# Patient Record
Sex: Female | Born: 2000 | Race: Black or African American | Hispanic: No | Marital: Single | Smoking: Never smoker
Health system: Southern US, Community
[De-identification: ages and names within clinical notes are randomized; demographics above are authoritative.]

## PROBLEM LIST (undated history)

## (undated) DIAGNOSIS — J45909 Unspecified asthma, uncomplicated: Secondary | ICD-10-CM

## (undated) DIAGNOSIS — L309 Dermatitis, unspecified: Secondary | ICD-10-CM

## (undated) HISTORY — PX: NO PAST SURGERIES: SHX2092

---
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
## 2018-01-08 IMAGING — CR DG CHEST 2V
2 series · 2 of 2 positions shown · non-contrast
Comparison: None.

CLINICAL DATA: Acute onset of wheezing, generalized weakness,
chills and vomiting. Initial encounter.

EXAM:
CHEST  2 VIEW

[w chest pa]
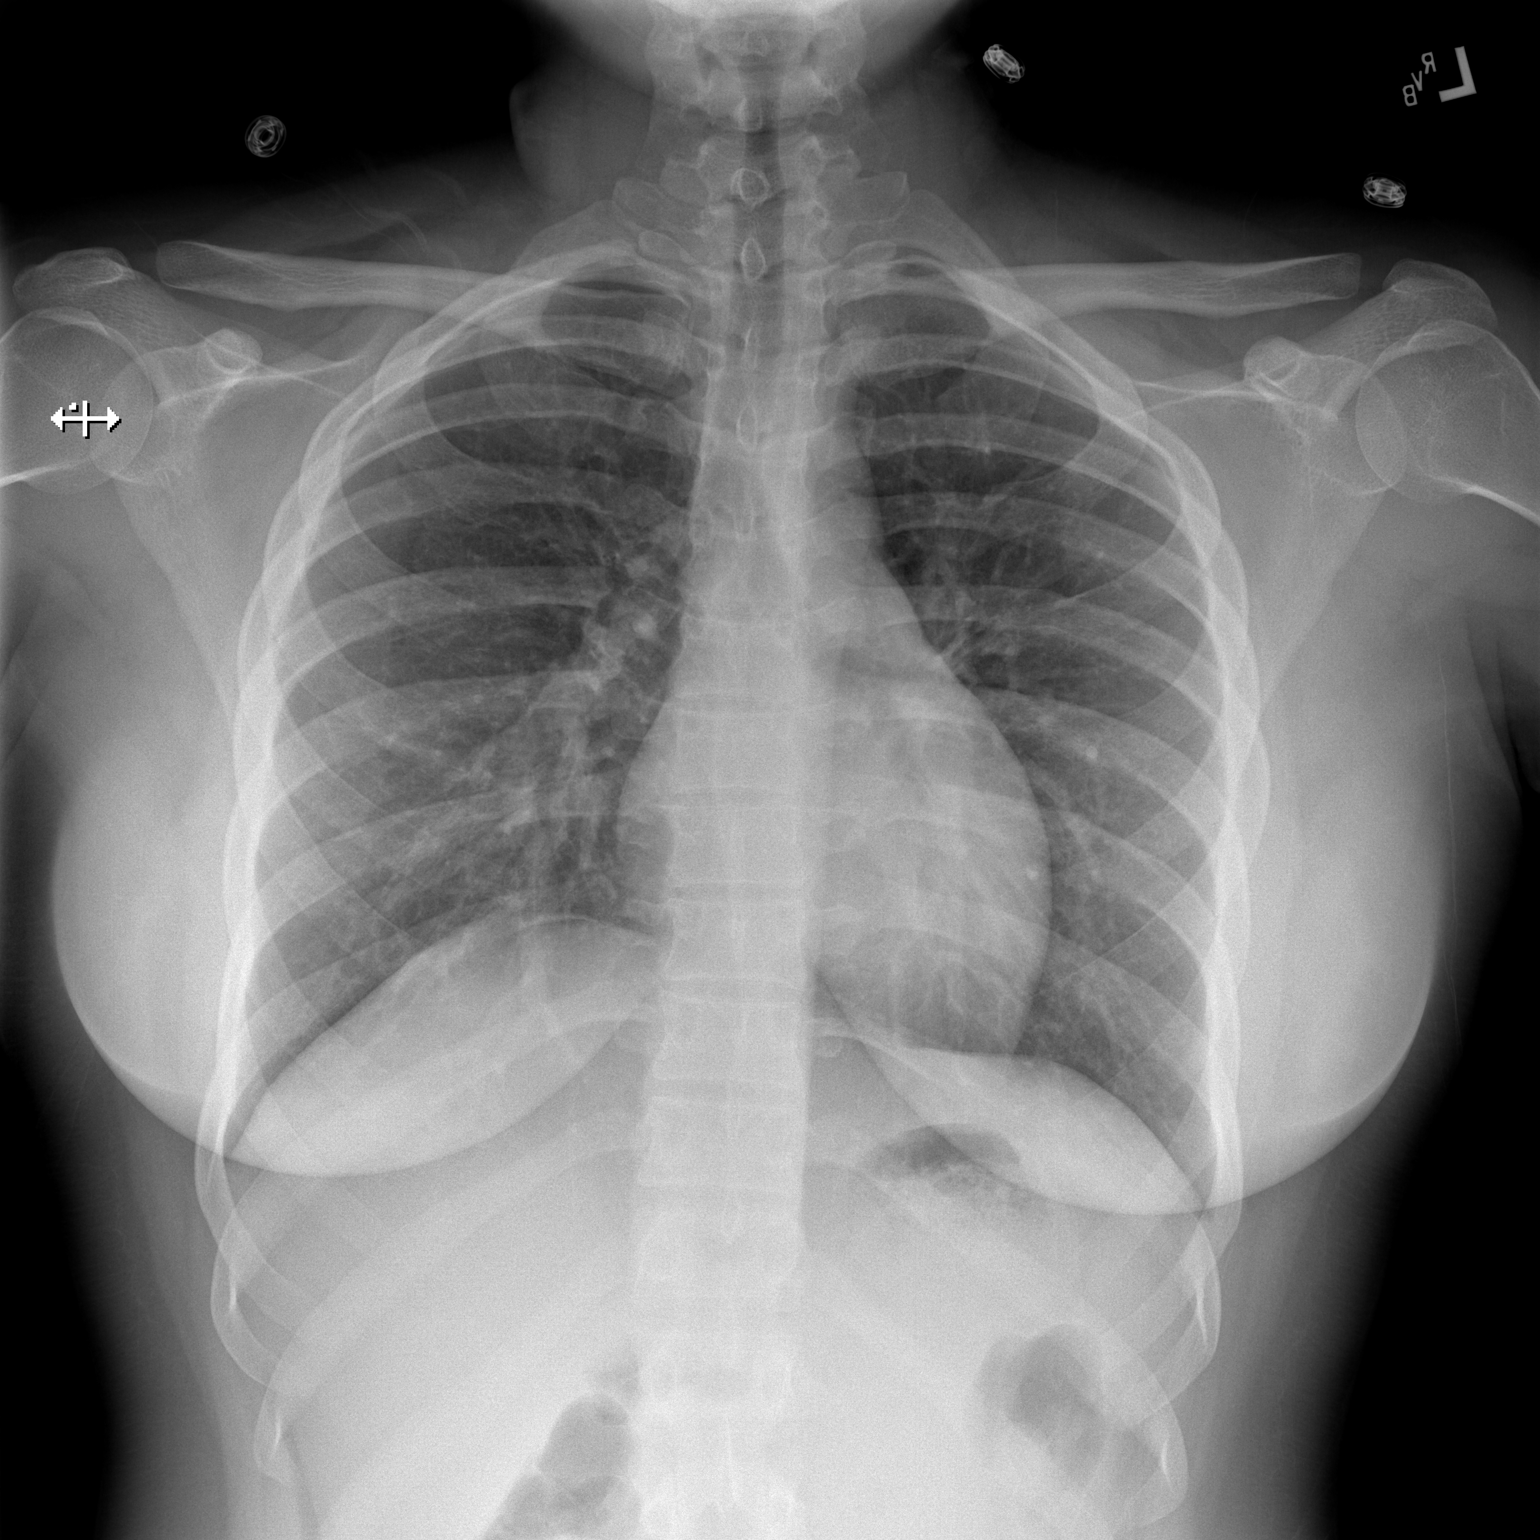

[w chest lat]
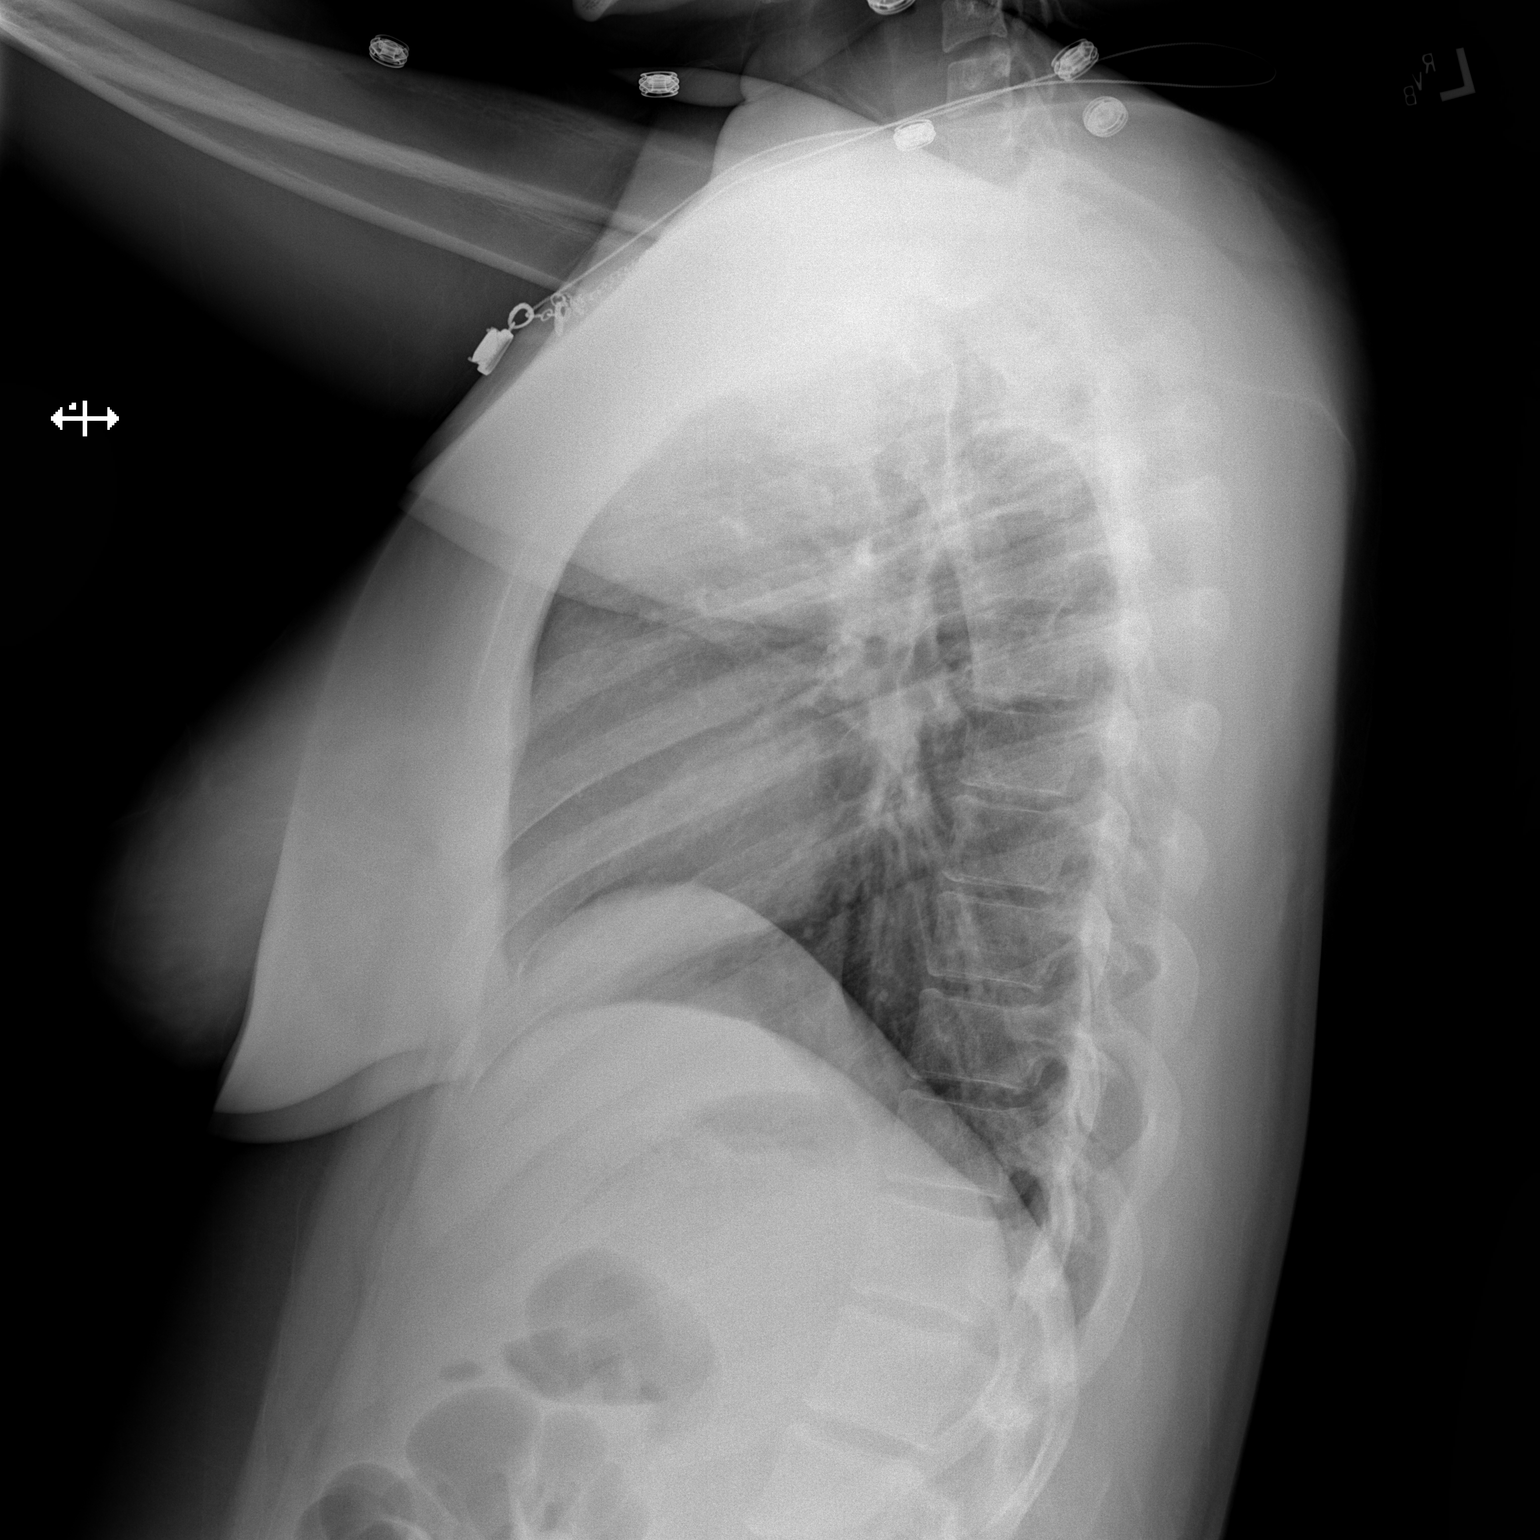

[2 of 2 positions shown; findings below may reference images not displayed]

FINDINGS: The lungs are well-aerated and clear. There is no evidence of focal
opacification, pleural effusion or pneumothorax.

The heart is normal in size; the mediastinal contour is within
normal limits. No acute osseous abnormalities are seen.
IMPRESSION: No acute cardiopulmonary process seen.

## 2020-02-20 DIAGNOSIS — Z20822 Contact with and (suspected) exposure to covid-19: Secondary | ICD-10-CM | POA: Diagnosis not present

## 2020-02-20 DIAGNOSIS — R0602 Shortness of breath: Secondary | ICD-10-CM | POA: Diagnosis not present

## 2020-02-20 DIAGNOSIS — Z1159 Encounter for screening for other viral diseases: Secondary | ICD-10-CM | POA: Diagnosis not present

## 2020-02-20 DIAGNOSIS — Z1331 Encounter for screening for depression: Secondary | ICD-10-CM | POA: Diagnosis not present

## 2020-02-20 DIAGNOSIS — Z114 Encounter for screening for human immunodeficiency virus [HIV]: Secondary | ICD-10-CM | POA: Diagnosis not present

## 2020-02-20 DIAGNOSIS — Z13220 Encounter for screening for lipoid disorders: Secondary | ICD-10-CM | POA: Diagnosis not present

## 2020-02-20 DIAGNOSIS — N915 Oligomenorrhea, unspecified: Secondary | ICD-10-CM | POA: Diagnosis not present

## 2020-02-20 DIAGNOSIS — Z7251 High risk heterosexual behavior: Secondary | ICD-10-CM | POA: Diagnosis not present

## 2020-02-20 DIAGNOSIS — E559 Vitamin D deficiency, unspecified: Secondary | ICD-10-CM | POA: Diagnosis not present

## 2020-02-20 DIAGNOSIS — Z0000 Encounter for general adult medical examination without abnormal findings: Secondary | ICD-10-CM | POA: Diagnosis not present

## 2020-02-20 DIAGNOSIS — Z1339 Encounter for screening examination for other mental health and behavioral disorders: Secondary | ICD-10-CM | POA: Diagnosis not present

## 2020-02-20 DIAGNOSIS — Z131 Encounter for screening for diabetes mellitus: Secondary | ICD-10-CM | POA: Diagnosis not present

## 2020-02-20 DIAGNOSIS — R5383 Other fatigue: Secondary | ICD-10-CM | POA: Diagnosis not present

## 2020-12-20 DIAGNOSIS — E663 Overweight: Secondary | ICD-10-CM | POA: Diagnosis not present

## 2020-12-20 DIAGNOSIS — Z113 Encounter for screening for infections with a predominantly sexual mode of transmission: Secondary | ICD-10-CM | POA: Diagnosis not present

## 2020-12-20 DIAGNOSIS — Z0184 Encounter for antibody response examination: Secondary | ICD-10-CM | POA: Diagnosis not present

## 2020-12-20 DIAGNOSIS — Z0000 Encounter for general adult medical examination without abnormal findings: Secondary | ICD-10-CM | POA: Diagnosis not present

## 2020-12-20 DIAGNOSIS — Z1329 Encounter for screening for other suspected endocrine disorder: Secondary | ICD-10-CM | POA: Diagnosis not present

## 2020-12-20 DIAGNOSIS — Z131 Encounter for screening for diabetes mellitus: Secondary | ICD-10-CM | POA: Diagnosis not present

## 2020-12-20 DIAGNOSIS — Z111 Encounter for screening for respiratory tuberculosis: Secondary | ICD-10-CM | POA: Diagnosis not present

## 2021-01-12 DIAGNOSIS — Z20828 Contact with and (suspected) exposure to other viral communicable diseases: Secondary | ICD-10-CM | POA: Diagnosis not present

## 2021-03-07 ENCOUNTER — Ambulatory Visit (INDEPENDENT_AMBULATORY_CARE_PROVIDER_SITE_OTHER): Payer: 59 | Admitting: Student

## 2021-03-07 ENCOUNTER — Other Ambulatory Visit (HOSPITAL_COMMUNITY)
Admission: RE | Admit: 2021-03-07 | Discharge: 2021-03-07 | Disposition: A | Discharge status: Home / Self Care | Payer: 59 | Source: Ambulatory Visit | Attending: Internal Medicine | Admitting: Internal Medicine

## 2021-03-07 VITALS — BP 135/82 | HR 54 | Temp 98.4°F | Ht 64.0 in | Wt 189.2 lb

## 2021-03-07 DIAGNOSIS — Z113 Encounter for screening for infections with a predominantly sexual mode of transmission: Secondary | ICD-10-CM | POA: Diagnosis not present

## 2021-03-07 DIAGNOSIS — Z0000 Encounter for general adult medical examination without abnormal findings: Secondary | ICD-10-CM

## 2021-03-07 DIAGNOSIS — E282 Polycystic ovarian syndrome: Secondary | ICD-10-CM

## 2021-03-07 NOTE — Progress Notes (Signed)
CC: STI Screening  HPI:  Ms.Katherine Ross is a 20 y.o. female with a past medical history stated below and presents today for STI screening. Please see problem based assessment and plan for additional details.  Past Medical History:  Diagnosis Date   Asthma    Eczema     Current Outpatient Medications on File Prior to Visit  Medication Sig Dispense Refill   albuterol (PROAIR HFA) 108 (90 Base) MCG/ACT inhaler Inhale 2 puffs into the lungs every 4 (four) hours as needed for wheezing or shortness of breath. 1 Inhaler 1   ARIPiprazole (ABILIFY) 15 MG tablet Take 0.5 tablets (7.5 mg total) by mouth at bedtime. (Patient not taking: Reported on 08/02/2016) 15 tablet 0   cetirizine (ZYRTEC) 10 MG tablet Take 1 tablet (10 mg total) by mouth daily. (Patient not taking: Reported on 10/01/2016) 30 tablet 5   hydrocortisone 2.5 % cream Apply topically 2 (two) times daily. (Patient not taking: Reported on 12/07/2016) 30 g 1   mometasone (NASONEX) 50 MCG/ACT nasal spray Place 2 sprays into the nose daily. (Patient not taking: Reported on 12/07/2016) 17 g 5   PATADAY 0.2 % SOLN Place 1 drop into both eyes daily. (Patient not taking: Reported on 12/07/2016) 1 Bottle 5   No current facility-administered medications on file prior to visit.    Family History  Problem Relation Age of Onset   Food Allergy Brother        tree nuts   Allergic rhinitis Maternal Grandmother    Angioedema Neg Hx    Asthma Neg Hx    Atopy Neg Hx    Eczema Neg Hx    Immunodeficiency Neg Hx    Urticaria Neg Hx     Social History   Socioeconomic History   Marital status: Single    Spouse name: Not on file   Number of children: Not on file   Years of education: Not on file   Highest education level: Not on file  Occupational History   Not on file  Tobacco Use   Smoking status: Every Day   Smokeless tobacco: Never  Substance and Sexual Activity   Alcohol use: Yes    Comment: social   Drug use: Yes    Types:  Marijuana   Sexual activity: Never  Other Topics Concern   Not on file  Social History Narrative   Not on file   Social Determinants of Health   Financial Resource Strain: Not on file  Food Insecurity: Not on file  Transportation Needs: Not on file  Physical Activity: Not on file  Stress: Not on file  Social Connections: Not on file  Intimate Partner Violence: Not on file    Review of Systems: ROS negative except for what is noted on the assessment and plan.  Vitals:   03/07/21 0923  BP: 135/82  Pulse: (!) 54  Temp: 98.4 F (36.9 C)  TempSrc: Oral  SpO2: 100%  Weight: 189 lb 3.2 oz (85.8 kg)  Height: 5\' 4"  (1.626 m)     Physical Exam: Constitutional: Well-appearing, no acute distress HENT: normocephalic atraumatic Eyes: conjunctiva non-erythematous Neck: supple Cardiovascular: regular rate Pulmonary/Chest: normal work of breathing on room air MSK: normal bulk and tone Neurological: alert & oriented x 3 Skin: warm and dry Psych: Normal mood and thought process   Assessment & Plan:   See Encounters Tab for problem based charting.  Patient discussed with Dr. , D.O. [facility],  PGY-2 Pager: [phone], Phone: [phone] Date 03/07/2021 Time 10:19 AM

## 2021-03-07 NOTE — Assessment & Plan Note (Signed)
Patient frequently travels to and from [place].  We discussed CDC recommendations and guidelines.  She has most of the vaccinations that are recommended.  We discussed typhoid and typhoid vaccine.  Recommended patient agreed on CDC guidelines for frequent travels to and from [place].

## 2021-03-07 NOTE — Patient Instructions (Addendum)
Thank you, Ms.Katherine Ross for allowing [place] to provide your care today. Today we discussed .  Screening Tests We will be performing screening tests for gonarrhea, chlamydia, trichomonas, HIV, and sphilis. I will call you with abnormal results to discuss further. If normal, I will send you a message via mychart.     I have ordered the following labs for you:  Lab Orders         HIV antibody (with reflex)         RPR       Referrals ordered today:   Referral Orders         Ambulatory referral to Obstetrics / Gynecology       I have ordered the following medication/changed the following medications:   Stop the following medications: There are no discontinued medications.   Start the following medications: No orders of the defined types were placed in this encounter.    Follow up: 3-4 months as needed   Should you have any questions or concerns please call the internal medicine clinic at [phone].    Thalia Bloodgood, D.O. [facility]

## 2021-03-07 NOTE — Assessment & Plan Note (Signed)
Assessment: Patient states in the past she was told that she had regular menstrual cycles due to " something with her ovaries."  PCOS is listed in her past medical history list, I suspect that this is the cause.  She states in the past she was on oral birth control to help with heavy bleeding.  At this time she continues to endorse irregular menstrual cycles that can occur every other week, monthly or every other month.  She notes bleeding for 3 days with the second day being the heaviest.  She would like to see a gynecologist when she returns back from her trip to [place].  We will go ahead and place referral and request that this be scheduled for March when she returns.  We discussed different types of contraception such as IUDs, Depo shot, and birth control implants.  Plan: -OBGYN referral placed

## 2021-03-07 NOTE — Assessment & Plan Note (Signed)
Assessment: Patient states that she would like screening for STIs.  She has had 2 female sexual partners in the past few months and used barrier protection intermittently.  She has been asymptomatic and did not endorse her partners being symptomatic.  We discussed safe sexual encounter practices such as barrier protection like condoms as well as contraception if she is not attempting to get pregnant.  Talked about Depo, IUD, birth control implants.  She would like to follow-up with gynecologist for these.  I encouraged her to have sexually-transmitted infection testing in between every new sexual partner.  Patient's mother works at our clinic and patient would like me to not share any information with her including this last requested by the patient.  Plan: -STI screening-HIV antibody, RPR, vaginal swab of GC chlamydia, BV, trichomoniasis pending

## 2021-03-08 LAB — RPR: RPR Ser Ql: NONREACTIVE

## 2021-03-08 LAB — HIV ANTIBODY (ROUTINE TESTING W REFLEX): HIV Screen 4th Generation wRfx: NONREACTIVE

## 2021-03-09 LAB — CERVICOVAGINAL ANCILLARY ONLY
Bacterial Vaginitis (gardnerella): POSITIVE — AB
Chlamydia: NEGATIVE
Comment: NEGATIVE
Comment: NEGATIVE
Comment: NORMAL
Neisseria Gonorrhea: NEGATIVE

## 2021-03-09 NOTE — Progress Notes (Signed)
Internal Medicine Clinic Attending  Case discussed with Dr. Katsadouros  At the time of the visit.  We reviewed the resident's history and exam and pertinent patient test results.  I agree with the assessment, diagnosis, and plan of care documented in the resident's note.  

## 2022-01-03 ENCOUNTER — Inpatient Hospital Stay (HOSPITAL_COMMUNITY)
Admission: AD | Admit: 2022-01-03 | Discharge: 2022-01-03 | Disposition: A | Discharge status: Home / Self Care | Payer: 59 | Attending: Obstetrics and Gynecology | Admitting: Obstetrics and Gynecology

## 2022-01-03 ENCOUNTER — Encounter (HOSPITAL_COMMUNITY): Payer: Self-pay | Admitting: *Deleted

## 2022-01-03 ENCOUNTER — Other Ambulatory Visit: Payer: Self-pay | Admitting: Advanced Practice Midwife

## 2022-01-03 DIAGNOSIS — N926 Irregular menstruation, unspecified: Secondary | ICD-10-CM

## 2022-01-03 DIAGNOSIS — O26891 Other specified pregnancy related conditions, first trimester: Secondary | ICD-10-CM | POA: Diagnosis not present

## 2022-01-03 DIAGNOSIS — O3680X0 Pregnancy with inconclusive fetal viability, not applicable or unspecified: Secondary | ICD-10-CM

## 2022-01-03 DIAGNOSIS — Z3A01 Less than 8 weeks gestation of pregnancy: Secondary | ICD-10-CM | POA: Diagnosis not present

## 2022-01-03 DIAGNOSIS — R110 Nausea: Secondary | ICD-10-CM | POA: Insufficient documentation

## 2022-01-03 MED ORDER — DOXYLAMINE-PYRIDOXINE 10-10 MG PO TBEC: DELAYED_RELEASE_TABLET | ORAL | 6 refills | Status: DC

## 2022-01-03 NOTE — MAU Note (Signed)
Katherine Ross is a 21 y.o. at Unknown here in MAU reporting: had preg confirmed today.  Wanting to know when she got preg (explained- never able to know that exactly). No pain, no bleeding . Just nausea.  This is her first, kind of anxious.   LMP: 8/12 was the end, unsure of start, l3-5 days usually. Onset of complaint: a wk Pain score: none Vitals:   01/03/22 1818  BP: 138/77  Pulse: 73  Resp: 18  Temp: 98.1 F (36.7 C)  SpO2: 100%      Lab orders placed from triage:  none Pt has proof of preg with her.  Copy made for medical records.

## 2022-01-03 NOTE — MAU Provider Note (Signed)
Event Date/Time   First Provider Initiated Contact with Patient 01/03/22 1855      Chief Complaint:  wanting [place]  (Wants to know her due date) and Nausea   Katherine Ross is  21 y.o. G1P0 at [redacted]w[redacted]d presents complaining of wanting [place]  (Wants to know her due date) and Nausea Seen at [facility] today for pregnancy confirmation.  LMP ended on 8/13, can't remember what day it started. Seemed like a normal period. Periods usually last 3-5 days, so 11/22/21 chosen as probably 1st day of LMP, making her EDC 08/29/22, 6.[redacted] weeks GA.  Does not have an OB provider, has a list. Having nausea.    Obstetrical/Gynecological History: OB History     Gravida  1   Para      Term      Preterm      AB      Living         SAB      IAB      Ectopic      Multiple      Live Births             Past Medical History: Past Medical History:  Diagnosis Date   Asthma    Eczema     Past Surgical History: Past Surgical History:  Procedure Laterality Date   NO PAST SURGERIES      Family History: Family History  Problem Relation Age of Onset   Food Allergy Brother        tree nuts   Allergic rhinitis Maternal Grandmother    Angioedema Neg Hx    Asthma Neg Hx    Atopy Neg Hx    Eczema Neg Hx    Immunodeficiency Neg Hx    Urticaria Neg Hx     Social History: Social History   Tobacco Use   Smoking status: Every Day   Smokeless tobacco: Never  Substance Use Topics   Alcohol use: Yes    Comment: social   Drug use: Yes    Types: Marijuana    Allergies:  Allergies  Allergen Reactions   Apple Juice    Penicillins     All kinds    Pollen Extract     Meds:  No medications prior to admission.    Review of Systems   Constitutional: Negative for fever and chills Eyes: Negative for visual disturbances Respiratory: Negative for shortness of breath, dyspnea Cardiovascular: Negative for chest pain or palpitations  Gastrointestinal: Negative for diarrhea and  constipation Genitourinary: Negative for dysuria and urgency Musculoskeletal: Negative for back pain, joint pain, myalgias.  Normal ROM  Neurological: Negative for dizziness and headaches    Physical Exam  Blood pressure 138/77, pulse 73, temperature 98.1 F (36.7 C), temperature source Oral, resp. rate 18, height 5\' 5"  (1.651 m), weight 83.1 kg, last menstrual period 11/20/2021, SpO2 100 %. GENERAL: Well-developed, well-nourished female in no acute distress.  LUNGS: Normal respiratory effort HEART: Regular rate and rhythm.  Labs: No results found for this or any previous visit (from the past 24 hour(s)). Imaging Studies:  No results found.  Assessment: Katherine Ross is  21 y.o. G1P0 at [redacted]w[redacted]d presents with wanting to know when she got pregnant.  If LMP is accurate, most likely conceived at end of August.  Plan: Order for dating September placed, to be done at [facility] (recommended by Dr. [place]) on 10/2 (pt's boyfriend lives in 12/2, will be in the [place] on that day).  Joaquim Lai Cresenzo-Dishmon 9/21/20239:50 PM

## 2022-01-03 NOTE — MAU Note (Signed)
Called back by NT for urine spec, pt has proof of preg, from [facility], done this morning.  Wanting an [place], to confirm dating.

## 2022-01-04 LAB — HM HIV SCREENING LAB: HM HIV Screening: NEGATIVE

## 2022-01-14 ENCOUNTER — Ambulatory Visit (HOSPITAL_BASED_OUTPATIENT_CLINIC_OR_DEPARTMENT_OTHER): Payer: 59

## 2022-02-04 LAB — HEPATITIS C ANTIBODY: HCV Ab: NEGATIVE

## 2022-02-05 ENCOUNTER — Ambulatory Visit (INDEPENDENT_AMBULATORY_CARE_PROVIDER_SITE_OTHER): Payer: 59

## 2022-02-05 ENCOUNTER — Other Ambulatory Visit (HOSPITAL_COMMUNITY)
Admission: RE | Admit: 2022-02-05 | Discharge: 2022-02-05 | Disposition: A | Discharge status: Home / Self Care | Payer: 59 | Source: Ambulatory Visit | Attending: Medical | Admitting: Medical

## 2022-02-05 ENCOUNTER — Ambulatory Visit (INDEPENDENT_AMBULATORY_CARE_PROVIDER_SITE_OTHER): Payer: 59 | Admitting: Obstetrics & Gynecology

## 2022-02-05 ENCOUNTER — Ambulatory Visit (INDEPENDENT_AMBULATORY_CARE_PROVIDER_SITE_OTHER): Payer: 59 | Admitting: *Deleted

## 2022-02-05 ENCOUNTER — Encounter (HOSPITAL_BASED_OUTPATIENT_CLINIC_OR_DEPARTMENT_OTHER): Payer: Self-pay | Admitting: Obstetrics & Gynecology

## 2022-02-05 VITALS — BP 125/81 | HR 88 | Wt 180.4 lb

## 2022-02-05 DIAGNOSIS — E669 Obesity, unspecified: Secondary | ICD-10-CM | POA: Diagnosis not present

## 2022-02-05 DIAGNOSIS — Z6830 Body mass index (BMI) 30.0-30.9, adult: Secondary | ICD-10-CM | POA: Diagnosis not present

## 2022-02-05 DIAGNOSIS — O3680X1 Pregnancy with inconclusive fetal viability, fetus 1: Secondary | ICD-10-CM

## 2022-02-05 DIAGNOSIS — J4530 Mild persistent asthma, uncomplicated: Secondary | ICD-10-CM | POA: Diagnosis not present

## 2022-02-05 DIAGNOSIS — Z3A10 10 weeks gestation of pregnancy: Secondary | ICD-10-CM

## 2022-02-05 DIAGNOSIS — Z3481 Encounter for supervision of other normal pregnancy, first trimester: Secondary | ICD-10-CM | POA: Diagnosis not present

## 2022-02-05 DIAGNOSIS — Z3A11 11 weeks gestation of pregnancy: Secondary | ICD-10-CM

## 2022-02-05 DIAGNOSIS — Z3401 Encounter for supervision of normal first pregnancy, first trimester: Secondary | ICD-10-CM

## 2022-02-05 DIAGNOSIS — O3680X0 Pregnancy with inconclusive fetal viability, not applicable or unspecified: Secondary | ICD-10-CM

## 2022-02-05 DIAGNOSIS — Z8659 Personal history of other mental and behavioral disorders: Secondary | ICD-10-CM

## 2022-02-05 NOTE — Progress Notes (Addendum)
History:   Dominisha Kampman is a 21 y.o. G1P0 at [redacted]w[redacted]d by LMP being seen today for her first obstetrical visit.  Her obstetrical history is significant for  obesity with BMI 30.02 . Patient does intend to breast feed. Pregnancy history fully reviewed.  Patient reports no complaints.      HISTORY: OB History  Gravida Para Term Preterm AB Living  1 0 0 0 0 0  SAB IAB Ectopic Multiple Live Births  0 0 0 0 0    # Outcome Date GA Lbr Len/2nd Weight Sex Delivery Anes PTL Lv  1 Current             Last pap smear was done 2022 and was  normal per her report.  Release signed.  Past Medical History:  Diagnosis Date   Asthma    Eczema    Past Surgical History:  Procedure Laterality Date   NO PAST SURGERIES     Family History  Problem Relation Age of Onset   Diabetes Mother    Hypertension Mother    Hyperlipidemia Mother    Food Allergy Brother        tree nuts   Autism Brother    Schizophrenia Brother    Hyperlipidemia Maternal Grandmother    Hypertension Maternal Grandmother    Diabetes Maternal Grandmother    Allergic rhinitis Maternal Grandmother    Hypothyroidism Maternal Grandmother    Heart attack Maternal Grandfather    Angioedema Neg Hx    Asthma Neg Hx    Atopy Neg Hx    Eczema Neg Hx    Immunodeficiency Neg Hx    Urticaria Neg Hx    Social History   Tobacco Use   Smoking status: Never   Smokeless tobacco: Never  Vaping Use   Vaping Use: Never used  Substance Use Topics   Alcohol use: Not Currently    Comment: social   Drug use: Not Currently    Types: Marijuana   Allergies  Allergen Reactions   Apple Itching   Penicillins     All kinds    Pollen Extract    Current Outpatient Medications on File Prior to Visit  Medication Sig Dispense Refill   Prenatal Vit-Fe Fumarate-FA (MULTIVITAMIN-PRENATAL) 27-0.8 MG TABS tablet Take 1 tablet by mouth daily at 12 noon.     Doxylamine-Pyridoxine (DICLEGIS) 10-10 MG TBEC Take 2 qhs; may also take one in am and  one in afternoon prn nausea (Patient not taking: Reported on 02/05/2022) 120 tablet 6   No current facility-administered medications on file prior to visit.    Review of Systems Pertinent items noted in HPI and remainder of comprehensive ROS otherwise negative.  Physical Exam:   Vitals:   02/05/22 1539  BP: 125/81  Pulse: 88  Weight: 180 lb 6.4 oz (81.8 kg)     Ultrasound was performed.  [place] IUP with normal FCA noted.  No subchorionic hemorrhage.  Cervix long and closed.  General: well-developed, well-nourished female in no acute distress  Breasts:  deferred  Skin: normal coloration and turgor, no rashes  Neurologic: oriented, normal, negative, normal mood  Extremities: normal strength, tone, and muscle mass, ROM of all joints is normal  HEENT PERRLA, extraocular movement intact and sclera clear, anicteric  Neck supple and no masses  Cardiovascular: regular rate and rhythm  Respiratory:  no respiratory distress, normal breath sounds  Abdomen: soft, non-tender; bowel sounds normal; no masses,  no organomegaly  Pelvic: normal external genitalia, no lesions,  normal vaginal mucosa, normal vaginal discharge, normal cervix, no pap smear done. Uterine size:  10 weeks    Assessment:    Pregnancy: G1P0 Patient Active Problem List   Diagnosis Date Noted   History of depression 02/08/2022   PCOS (polycystic ovarian syndrome) 10/30/2015   Obesity 08/22/2015   Eczema 06/21/2015   Asthma, mild persistent 06/21/2015   Other seasonal allergic rhinitis 06/21/2015   Major depressive disorder, single episode, unspecified 05/27/2015   DMDD (disruptive mood dysregulation disorder) ([place]) 05/27/2015   Cannabis abuse 05/27/2015     Plan:    1. Encounter for supervision of normal first pregnancy in first trimester - Prenatal Vit-Fe Fumarate-FA (MULTIVITAMIN-PRENATAL) 27-0.8 MG TABS tablet; Take 1 tablet by mouth daily at 12 noon. - [place] MFM OB DETAIL +14 WK; Future - ABO/Rh - Antibody  screen - CBC - Hepatitis B surface antigen - HIV Antibody (routine testing w rflx) - HIV (Save tube for possible reflex) - RPR - Rubella screen - Hepatitis C antibody - Urine Culture - Cervicovaginal ancillary only( [place]) - Hemoglobin A1c - Panorama Prenatal Test Full Panel - [place] Custom  2. Class 1 obesity without serious comorbidity with body mass index (BMI) of 30.0 to 30.9 in adult, unspecified obesity type - Hemoglobin A1c  3. Mild persistent asthma without complication  4. History of depression  5. [redacted] weeks gestation of pregnancy   Initial labs drawn. Continue prenatal vitamins. Problem list reviewed and updated. Genetic Screening discussed, NIPS: ordered. Ultrasound discussed; fetal anatomic survey: ordered. Anticipatory guidance about prenatal visits given including labs, ultrasounds, and testing. Discussed usage of Babyscripts and virtual visits as additional source of managing and completing prenatal visits in midst of coronavirus and pandemic.   Encouraged to complete MyChart Registration for her ability to review results, send requests, and have questions addressed.  The nature of [place] [facility] with multiple MDs and Advanced Practice Providers was explained to patient; also emphasized that residents, students are part of our team. Routine obstetric precautions reviewed. Encouraged to seek out care at office or emergency room [facility] MAU preferred) for urgent and/or emergent concerns. Return in about 4 weeks (around 03/05/2022).     Felipa Emory, MD, [place], [facility] for [facility], [place]

## 2022-02-05 NOTE — Progress Notes (Signed)
New OB Intake  I explained I am completing New OB Intake today. We discussed EDD of 08/27/22 that is based on LMP of 11/20/21. Pt is G1/P0. I reviewed her allergies, medications, Medical/Surgical/OB history, and appropriate screenings. I informed her of [facility] services. [facility] information placed in AVS. Based on history, this is a low risk pregnancy.  Patient Active Problem List   Diagnosis Date Noted   Healthcare maintenance 03/07/2021   Routine screening for STI (sexually transmitted infection) 03/07/2021   PCOS (polycystic ovarian syndrome) 10/30/2015   Obesity 08/22/2015   Eczema 06/21/2015   Asthma, mild persistent 06/21/2015   Other seasonal allergic rhinitis 06/21/2015   Major depressive disorder, single episode, unspecified 05/27/2015   DMDD (disruptive mood dysregulation disorder) ([place]) 05/27/2015   Cannabis abuse 05/27/2015    Concerns addressed today  Delivery Plans Plans to deliver at [facility]. Patient given information for [facility] Healthy Baby website for more information about [facility] and [place].  MyChart/Babyscripts MyChart access verified. I explained pt will have some visits in office and some virtually. Babyscripts instructions given and order placed.   Blood Pressure Cuff/Weight Scale Patient has private insurance; instructed to purchase blood pressure cuff and bring to first prenatal appt. Explained after first prenatal appt pt will check weekly and document in 14.   Anatomy US Explained first scheduled [place] will be around 19 weeks. Anatomy US scheduled for 04/05/22 at 1445. Pt notified to arrive at 1430.  Labs Discussed Johnsie Cancel genetic screening with patient. Would like both Panorama and Horizon drawn at new OB visit. Routine prenatal labs needed.   Social Determinants of Health Food Insecurity: Patient denies food insecurity. Transportation: Patient expressed transportation needs.   Blenda Nicely, RN 02/05/2022  4:08 PM

## 2022-02-06 DIAGNOSIS — Z3401 Encounter for supervision of normal first pregnancy, first trimester: Secondary | ICD-10-CM | POA: Diagnosis not present

## 2022-02-06 LAB — CBC
Hematocrit: 34.7 % (ref 34.0–46.6)
Hemoglobin: 11.7 g/dL (ref 11.1–15.9)
MCH: 27 pg (ref 26.6–33.0)
MCHC: 33.7 g/dL (ref 31.5–35.7)
MCV: 80 fL (ref 79–97)
Platelets: 366 10*3/uL (ref 150–450)
RBC: 4.33 x10E6/uL (ref 3.77–5.28)
RDW: 13.5 % (ref 11.7–15.4)
WBC: 9.3 10*3/uL (ref 3.4–10.8)

## 2022-02-06 LAB — CERVICOVAGINAL ANCILLARY ONLY
Chlamydia: NEGATIVE
Comment: NEGATIVE
Comment: NORMAL
Neisseria Gonorrhea: NEGATIVE

## 2022-02-06 LAB — HEPATITIS B SURFACE ANTIGEN: Hepatitis B Surface Ag: NEGATIVE

## 2022-02-06 LAB — HEMOGLOBIN A1C
Est. average glucose Bld gHb Est-mCnc: 114 mg/dL
Hgb A1c MFr Bld: 5.6 % (ref 4.8–5.6)

## 2022-02-06 LAB — ANTIBODY SCREEN: Antibody Screen: NEGATIVE

## 2022-02-06 LAB — RPR: RPR Ser Ql: NONREACTIVE

## 2022-02-06 LAB — HEPATITIS C ANTIBODY: Hep C Virus Ab: NONREACTIVE

## 2022-02-06 LAB — RUBELLA SCREEN: Rubella Antibodies, IGG: 3.18 index (ref >0.99)

## 2022-02-06 LAB — ABO/RH: Rh Factor: POSITIVE

## 2022-02-06 LAB — HIV ANTIBODY (ROUTINE TESTING W REFLEX): HIV Screen 4th Generation wRfx: NONREACTIVE

## 2022-02-08 ENCOUNTER — Encounter (HOSPITAL_BASED_OUTPATIENT_CLINIC_OR_DEPARTMENT_OTHER): Payer: 59 | Admitting: Medical

## 2022-02-08 DIAGNOSIS — Z8659 Personal history of other mental and behavioral disorders: Secondary | ICD-10-CM | POA: Insufficient documentation

## 2022-02-08 LAB — URINE CULTURE

## 2022-02-11 DIAGNOSIS — Z3401 Encounter for supervision of normal first pregnancy, first trimester: Secondary | ICD-10-CM | POA: Insufficient documentation

## 2022-02-11 DIAGNOSIS — Z3403 Encounter for supervision of normal first pregnancy, third trimester: Secondary | ICD-10-CM | POA: Insufficient documentation

## 2022-02-11 LAB — PANORAMA PRENATAL TEST FULL PANEL:PANORAMA TEST PLUS 5 ADDITIONAL MICRODELETIONS: FETAL FRACTION: 5.7

## 2022-02-19 ENCOUNTER — Encounter (HOSPITAL_BASED_OUTPATIENT_CLINIC_OR_DEPARTMENT_OTHER): Payer: Self-pay | Admitting: *Deleted

## 2022-02-19 ENCOUNTER — Encounter (HOSPITAL_BASED_OUTPATIENT_CLINIC_OR_DEPARTMENT_OTHER): Payer: 59 | Admitting: Obstetrics & Gynecology

## 2022-02-19 LAB — HORIZON CUSTOM: REPORT SUMMARY: POSITIVE — AB

## 2022-02-21 ENCOUNTER — Ambulatory Visit (INDEPENDENT_AMBULATORY_CARE_PROVIDER_SITE_OTHER): Payer: 59 | Admitting: Obstetrics & Gynecology

## 2022-02-21 ENCOUNTER — Other Ambulatory Visit (HOSPITAL_COMMUNITY)
Admission: RE | Admit: 2022-02-21 | Discharge: 2022-02-21 | Disposition: A | Discharge status: Home / Self Care | Payer: 59 | Source: Ambulatory Visit | Attending: Obstetrics & Gynecology | Admitting: Obstetrics & Gynecology

## 2022-02-21 ENCOUNTER — Encounter (HOSPITAL_BASED_OUTPATIENT_CLINIC_OR_DEPARTMENT_OTHER): Payer: Self-pay | Admitting: *Deleted

## 2022-02-21 VITALS — BP 118/84 | HR 93 | Wt 182.2 lb

## 2022-02-21 DIAGNOSIS — Z3A13 13 weeks gestation of pregnancy: Secondary | ICD-10-CM

## 2022-02-21 DIAGNOSIS — Z3402 Encounter for supervision of normal first pregnancy, second trimester: Secondary | ICD-10-CM

## 2022-02-21 DIAGNOSIS — Z124 Encounter for screening for malignant neoplasm of cervix: Secondary | ICD-10-CM

## 2022-02-21 DIAGNOSIS — D563 Thalassemia minor: Secondary | ICD-10-CM

## 2022-02-21 DIAGNOSIS — J4530 Mild persistent asthma, uncomplicated: Secondary | ICD-10-CM

## 2022-02-21 MED ORDER — ASPIRIN 81 MG PO CHEW: 81.0000 mg | CHEWABLE_TABLET | Freq: Every day | ORAL | Status: DC

## 2022-02-21 NOTE — Progress Notes (Signed)
   PRENATAL VISIT NOTE  Subjective:  Katherine Ross is a 21 y.o. G1P0 at [redacted]w[redacted]d being seen today for ongoing prenatal care.  She is currently monitored for the following issues for this low-risk pregnancy and has Eczema; Asthma, mild persistent; Obesity; PCOS (polycystic ovarian syndrome); Encounter for supervision of normal first pregnancy in first trimester; and Alpha thalassemia silent carrier on their problem list.  Patient reports  sinus congestion.  OTC options discussed .  Contractions: Not present. Vag. Bleeding: None.  Movement: Absent. Denies leaking of fluid.   The following portions of the patient's history were reviewed and updated as appropriate: allergies, current medications, past family history, past medical history, past social history, past surgical history and problem list.   Objective:   Vitals:   02/21/22 1535  BP: 118/84  Pulse: 93  Weight: 182 lb 3.2 oz (82.6 kg)    Fetal Status:     Movement: Absent     General:  Alert, oriented and cooperative. Patient is in no acute distress.  Skin: Skin is warm and dry. No rash noted.   Cardiovascular: Normal heart rate noted  Respiratory: Normal respiratory effort, no problems with respiration noted  Abdomen: Soft, gravid, appropriate for gestational age.  Pain/Pressure: Absent     Pelvic: NAEFG, vagina normal, cervix normal, pap obtained.  Chaperone present         Extremities: Normal range of motion.  Edema: None  Mental Status: Normal mood and affect. Normal behavior. Normal judgment and thought content.   Assessment and Plan:  Pregnancy: G1P0 at [redacted]w[redacted]d 1. [redacted] weeks gestation of pregnancy - on PNV - pt traveling to [place] to see FOB family for about a month - anatomy scan scheduled for right after return - aspirin 81 MG chewable tablet; Chew 1 tablet (81 mg total) by mouth daily.  2. Encounter for supervision of normal first pregnancy in second trimester  3. Cervical cancer screening - Cytology - PAP( [facility])  4. Alpha thalassemia silent carrier - discussed results.  FOB testing offered.  5. Mild persistent asthma without complication -stable   Preterm labor symptoms and general obstetric precautions including but not limited to vaginal bleeding, contractions, leaking of fluid and fetal movement were reviewed in detail with the patient. Please refer to After Visit Summary for other counseling recommendations.   Return in about 4 weeks (around 03/21/2022).  Future Appointments  Date Time Provider Department Center  04/05/2022  2:30 PM [facility] NURSE [facility]  04/05/2022  2:45 PM [facility] US4 [facility]  04/16/2022 10:55 AM Jerene Bears, MD [facility]  05/31/2022 10:35 AM Jerene Bears, MD [facility]  06/21/2022 10:35 AM Jerene Bears, MD [facility]  07/08/2022  9:55 AM Jerene Bears, MD [facility]  07/19/2022 10:35 AM Jerene Bears, MD [facility]  08/02/2022 10:35 AM Jerene Bears, MD [facility]  08/09/2022 10:35 AM Jerene Bears, MD [facility]  08/16/2022 10:15 AM Jerene Bears, MD [facility]  08/23/2022 10:35 AM Jerene Bears, MD [facility]  08/30/2022 10:15 AM Jerene Bears, MD [facility]    Jerene Bears, MD

## 2022-02-22 LAB — CYTOLOGY - PAP: Diagnosis: NEGATIVE

## 2022-03-12 ENCOUNTER — Encounter (HOSPITAL_BASED_OUTPATIENT_CLINIC_OR_DEPARTMENT_OTHER): Payer: 59 | Admitting: Obstetrics & Gynecology

## 2022-04-05 ENCOUNTER — Ambulatory Visit: Payer: 59 | Admitting: *Deleted

## 2022-04-05 ENCOUNTER — Ambulatory Visit: Payer: 59 | Attending: Obstetrics & Gynecology

## 2022-04-05 VITALS — BP 135/75 | HR 104

## 2022-04-05 DIAGNOSIS — Z363 Encounter for antenatal screening for malformations: Secondary | ICD-10-CM | POA: Insufficient documentation

## 2022-04-05 DIAGNOSIS — O99212 Obesity complicating pregnancy, second trimester: Secondary | ICD-10-CM | POA: Diagnosis not present

## 2022-04-05 DIAGNOSIS — O4442 Low lying placenta NOS or without hemorrhage, second trimester: Secondary | ICD-10-CM

## 2022-04-05 DIAGNOSIS — O209 Hemorrhage in early pregnancy, unspecified: Secondary | ICD-10-CM | POA: Insufficient documentation

## 2022-04-05 DIAGNOSIS — D563 Thalassemia minor: Secondary | ICD-10-CM | POA: Diagnosis not present

## 2022-04-05 DIAGNOSIS — Z3401 Encounter for supervision of normal first pregnancy, first trimester: Secondary | ICD-10-CM | POA: Insufficient documentation

## 2022-04-05 DIAGNOSIS — Z3A19 19 weeks gestation of pregnancy: Secondary | ICD-10-CM | POA: Diagnosis not present

## 2022-04-05 DIAGNOSIS — D569 Thalassemia, unspecified: Secondary | ICD-10-CM | POA: Diagnosis not present

## 2022-04-05 DIAGNOSIS — O321XX0 Maternal care for breech presentation, not applicable or unspecified: Secondary | ICD-10-CM | POA: Diagnosis not present

## 2022-04-10 ENCOUNTER — Other Ambulatory Visit: Payer: Self-pay | Admitting: *Deleted

## 2022-04-10 ENCOUNTER — Encounter (HOSPITAL_BASED_OUTPATIENT_CLINIC_OR_DEPARTMENT_OTHER): Payer: 59 | Admitting: Obstetrics & Gynecology

## 2022-04-10 DIAGNOSIS — O4443 Low lying placenta NOS or without hemorrhage, third trimester: Secondary | ICD-10-CM | POA: Insufficient documentation

## 2022-04-10 DIAGNOSIS — Z362 Encounter for other antenatal screening follow-up: Secondary | ICD-10-CM

## 2022-04-10 DIAGNOSIS — O4442 Low lying placenta NOS or without hemorrhage, second trimester: Secondary | ICD-10-CM | POA: Insufficient documentation

## 2022-04-15 NOTE — L&D Delivery Note (Signed)
Delivery Note I personally pushed with the patient for about 1 hour.  Poor maternal effort initially but eventually was able to push effectively.   At 1:47 PM a viable female was delivered via Vaginal, Spontaneous (Presentation: Left Occiput Anterior).  Nuchal cord x2, tight, unable to reduce. Delivered through. Delayed cord clamping at about 1.5 minutes.    APGAR: 6, 9; weight 8 lb 2.2 oz (3690 g).   Placenta status: Spontaneous, Intact.  Cord: 3 vessels with the following complications: None.  Cord pH: Not collected  Prior to delivery of placenta there was brisk bleeding. Pitocin started immediately. After delivery of the placenta, bleeding continued. Methergine ordered and received. Lower uterine segment was cleared of clots and debris.   Anesthesia: Epidural Episiotomy: None Lacerations: 1st degree;Perineal;right Labial Suture Repair: 3.0 vicryl Est. Blood Loss (mL): 1000  Mom to postpartum.  Baby to Couplet care / Skin to Skin.  Isa Rankin [facility] 09/05/2022, 2:26 PM

## 2022-04-16 ENCOUNTER — Encounter (HOSPITAL_BASED_OUTPATIENT_CLINIC_OR_DEPARTMENT_OTHER): Payer: Self-pay | Admitting: Obstetrics & Gynecology

## 2022-04-16 ENCOUNTER — Ambulatory Visit (INDEPENDENT_AMBULATORY_CARE_PROVIDER_SITE_OTHER): Payer: 59 | Admitting: Obstetrics & Gynecology

## 2022-04-16 VITALS — BP 136/83 | HR 102 | Wt 198.0 lb

## 2022-04-16 DIAGNOSIS — Z3402 Encounter for supervision of normal first pregnancy, second trimester: Secondary | ICD-10-CM

## 2022-04-16 DIAGNOSIS — D563 Thalassemia minor: Secondary | ICD-10-CM

## 2022-04-16 DIAGNOSIS — Z6832 Body mass index (BMI) 32.0-32.9, adult: Secondary | ICD-10-CM

## 2022-04-16 DIAGNOSIS — Z3401 Encounter for supervision of normal first pregnancy, first trimester: Secondary | ICD-10-CM

## 2022-04-16 DIAGNOSIS — O4442 Low lying placenta NOS or without hemorrhage, second trimester: Secondary | ICD-10-CM

## 2022-04-16 DIAGNOSIS — Z3A21 21 weeks gestation of pregnancy: Secondary | ICD-10-CM

## 2022-04-17 NOTE — Progress Notes (Signed)
   PRENATAL VISIT NOTE  Subjective:  Katherine Ross is a 22 y.o. G1P0 at [redacted]w[redacted]d being seen today for ongoing prenatal care.  She is currently monitored for the following issues for this low-risk pregnancy and has Eczema; Asthma, mild persistent; Obesity; PCOS (polycystic ovarian syndrome); Encounter for supervision of normal first pregnancy in first trimester; Alpha thalassemia silent carrier; and Low lying placenta nos or without hemorrhage, second trimester on their problem list.  Patient reports no complaints.  Contractions: Not present. Vag. Bleeding: None.  Movement: Present. Denies leaking of fluid.   The following portions of the patient's history were reviewed and updated as appropriate: allergies, current medications, past family history, past medical history, past social history, past surgical history and problem list.   Objective:   Vitals:   04/16/22 1152 04/16/22 1243  BP: (!) 140/75 136/83  Pulse: (!) 102   Weight: 198 lb (89.8 kg)     Fetal Status: Fetal Heart Rate (bpm): 147 Fundal Height: 20 cm Movement: Present     General:  Alert, oriented and cooperative. Patient is in no acute distress.  Skin: Skin is warm and dry. No rash noted.   Cardiovascular: Normal heart rate noted  Respiratory: Normal respiratory effort, no problems with respiration noted  Abdomen: Soft, gravid, appropriate for gestational age.  Pain/Pressure: Absent     Pelvic: Cervical exam deferred        Extremities: Normal range of motion.  Edema: Trace  Mental Status: Normal mood and affect. Normal behavior. Normal judgment and thought content.   Assessment and Plan:  Pregnancy: G1P0 at [redacted]w[redacted]d 1. Encounter for supervision of normal first pregnancy in second trimester - on PNV and baby ASA - AFP, Serum, Open Spina Bifida  2. Low lying placenta nos or without hemorrhage, second trimester - has follow up ultrasound scheduled with MFM  3. Alpha thalassemia silent carrier - FOB declined testing  4.  BMI 32.0-32.9,adult - hba1c normal.  GTT is planned between 26-28 weeks  Preterm labor symptoms and general obstetric precautions including but not limited to vaginal bleeding, contractions, leaking of fluid and fetal movement were reviewed in detail with the patient. Please refer to After Visit Summary for other counseling recommendations.   Return in about 4 weeks (around 05/14/2022).  Future Appointments  Date Time Provider [place]  05/10/2022  3:15 PM [facility] NURSE [facility]  05/10/2022  3:30 PM [facility] US2 [facility]  05/31/2022 10:35 AM Megan Salon, MD [facility]  06/21/2022 10:35 AM Megan Salon, MD [facility]  07/08/2022  9:55 AM Megan Salon, MD [facility]  07/19/2022 10:35 AM Megan Salon, MD [facility]  08/02/2022 10:35 AM Megan Salon, MD [facility]  08/09/2022 10:35 AM Megan Salon, MD [facility]  08/16/2022 10:15 AM Megan Salon, MD [facility]  08/23/2022 10:35 AM Megan Salon, MD [facility]  08/30/2022 10:15 AM Megan Salon, MD [facility]    Megan Salon, MD

## 2022-04-18 LAB — AFP, SERUM, OPEN SPINA BIFIDA
AFP MoM: 0.62
AFP Value: 37.5 ng/mL
Gest. Age on Collection Date: 21 weeks
Maternal Age At EDD: 24 yr
OSBR Risk 1 IN: 10000
Test Results:: NEGATIVE
Weight: 198 [lb_av]

## 2022-04-23 ENCOUNTER — Encounter (HOSPITAL_BASED_OUTPATIENT_CLINIC_OR_DEPARTMENT_OTHER): Payer: Self-pay | Admitting: Obstetrics & Gynecology

## 2022-05-08 ENCOUNTER — Encounter (HOSPITAL_BASED_OUTPATIENT_CLINIC_OR_DEPARTMENT_OTHER): Payer: 59 | Admitting: Obstetrics & Gynecology

## 2022-05-10 ENCOUNTER — Ambulatory Visit: Payer: 59 | Attending: Obstetrics and Gynecology

## 2022-05-10 ENCOUNTER — Ambulatory Visit: Payer: 59 | Admitting: *Deleted

## 2022-05-10 VITALS — BP 137/76 | HR 107

## 2022-05-10 DIAGNOSIS — D563 Thalassemia minor: Secondary | ICD-10-CM

## 2022-05-10 DIAGNOSIS — O99212 Obesity complicating pregnancy, second trimester: Secondary | ICD-10-CM

## 2022-05-10 DIAGNOSIS — E669 Obesity, unspecified: Secondary | ICD-10-CM

## 2022-05-10 DIAGNOSIS — Z362 Encounter for other antenatal screening follow-up: Secondary | ICD-10-CM

## 2022-05-10 DIAGNOSIS — O4442 Low lying placenta NOS or without hemorrhage, second trimester: Secondary | ICD-10-CM

## 2022-05-10 DIAGNOSIS — O285 Abnormal chromosomal and genetic finding on antenatal screening of mother: Secondary | ICD-10-CM

## 2022-05-10 DIAGNOSIS — Z3A24 24 weeks gestation of pregnancy: Secondary | ICD-10-CM | POA: Diagnosis not present

## 2022-05-13 ENCOUNTER — Other Ambulatory Visit: Payer: Self-pay | Admitting: *Deleted

## 2022-05-13 DIAGNOSIS — O99212 Obesity complicating pregnancy, second trimester: Secondary | ICD-10-CM

## 2022-05-13 DIAGNOSIS — Z362 Encounter for other antenatal screening follow-up: Secondary | ICD-10-CM

## 2022-05-13 DIAGNOSIS — O4440 Low lying placenta NOS or without hemorrhage, unspecified trimester: Secondary | ICD-10-CM

## 2022-05-31 ENCOUNTER — Ambulatory Visit (INDEPENDENT_AMBULATORY_CARE_PROVIDER_SITE_OTHER): Payer: 59 | Admitting: Obstetrics & Gynecology

## 2022-05-31 VITALS — BP 138/80 | HR 106 | Wt 201.6 lb

## 2022-05-31 DIAGNOSIS — D563 Thalassemia minor: Secondary | ICD-10-CM

## 2022-05-31 DIAGNOSIS — E661 Drug-induced obesity: Secondary | ICD-10-CM

## 2022-05-31 DIAGNOSIS — Z3A27 27 weeks gestation of pregnancy: Secondary | ICD-10-CM

## 2022-05-31 DIAGNOSIS — E669 Obesity, unspecified: Secondary | ICD-10-CM

## 2022-05-31 DIAGNOSIS — Z6832 Body mass index (BMI) 32.0-32.9, adult: Secondary | ICD-10-CM

## 2022-05-31 DIAGNOSIS — Z3402 Encounter for supervision of normal first pregnancy, second trimester: Secondary | ICD-10-CM | POA: Diagnosis not present

## 2022-05-31 DIAGNOSIS — Z3401 Encounter for supervision of normal first pregnancy, first trimester: Secondary | ICD-10-CM

## 2022-05-31 DIAGNOSIS — O4442 Low lying placenta NOS or without hemorrhage, second trimester: Secondary | ICD-10-CM

## 2022-05-31 DIAGNOSIS — Z880 Allergy status to penicillin: Secondary | ICD-10-CM

## 2022-05-31 NOTE — Patient Instructions (Signed)
[place] [phone]) [facility] Owens Shark, MD; Erin Hearing, MD; Gwendlyn Deutscher, MD; Andria Frames, MD; McDiarmid, MD; Dutch Quint, [place]., [place], [place] [address] [phone] Mon-Fri 8:30-12:30, 1:30-5:00  Providers come to see babies during newborn hospitalization Only accepting infants of Mother's who are seen at [facility] or have siblings seen at   [place] Medicaid - Yes; [place], MD [address], [place], [place] [address] [phone] Mon, Tue, Thur, Fri 8:30-5:00, Wed 10:00-7:00 (closed 1-2pm daily for lunch) [facility] residents with no insurance.  Gas only with Medicaid/insurance; Tricare - no  [facility] [address] [facility]) - Tim and [facility], MD; Owens Shark, MD; Tamera Punt, MD; Doneen Poisson, MD; Fatima Sanger, MD; Lindwood Qua, MD; Thornell Sartorius, MD; Ronnald Ramp,  MD; Wynetta Emery, MD; Jess Barters, MD; Tami Ribas, MD; Derrell Lolling, MD; Dorothyann Peng, MD; Lucious Groves, NP [address], [place], [place] [address] [phone] Mon, Tue, Thur, Fri 8:30-5:30, Wed 9:30-5:30, Sat 8:30-12:30 Only accepting infants of first-time parents or siblings of current patients Hospital discharge coordinator will make follow-up appointment Medicaid - yes; Tricare - yes  [place] [phone]) [facility] Garnette Czech, MD; Rosana Hoes, MD; Servando Salina, MD; Rose Fillers, MD; Corinna Capra, MD; [facility], MD; Javier Glazier, MD; Janann Colonel, MD; Jimmye Norman, [place], [place], [place] [address] [phone] Mon-Fri 8:30-5:00, closed for lunch 12:30-1:30; Sat-Sun 10:00-1:00 Accepting Newborns with commercial insurance only, must call prior to delivery to be accepted into  practice.  Medicaid - no, Tricare - yes   Clayton [place], [place] [address] [phone] or [phone] Mon to Fri 8am to 10pm, Sat 8am to 1pm  (virtual only on weekends) Only accepts Medicaid Healthy Blue pts  [facility]) - Pediatrics at Rogelia Boga, MD; Vilma Prader, MD; Vanita Panda, MD; Roxanne Mins, NP; Nestor Lewandowsky, MD; Ronney Lion, MD [address] [place], [place] [address] [phone] Mon-Fri 8:30-5:30 Medicaid - yes, Tricare - yes  [place] [phone]) [facility] Burnadette Pop, MD [address] [place] [address], [place], [place] [address] [phone] Sammuel Cooper, Wed Fri 8:30-5:00, Sat 8:30-12:00, Closed Thursdays Accepting siblings of established patients and first time mom's if you call prenatally Medicaid- yes; Tricare - yes  [place] at Arlina [place], [place]; Mannie Stabile, MD; Jerald Kief; [place], [place]; Nancy Fetter, MD; Moreen Fowler, MD;  [address], [place], [place] [address] [phone] Mon-Fri 8:30-5:00, closed for lunch 1-2 Only accepting newborns of established patients Medicaid- no; Tricare - yes  [facility] [phone]) [place] at Leane Para, MD; [place], [place], [place] [address] [phone] Mon-Fri 8:00-5:00 Medicaid - No; Tricare - Yes  [place] at [facility], [place]; [place], [place], [place], [place] [address] [phone] Mon-Fri 8:00-5:00 Medicaid - No, Tricare - Yes  [facility] Abner Greenspan, MD; Sheran Lawless, MD; [place], [place] [address], [place] [address] [place], [place] [address] [phone]  Mon-Fri 8:00-5:00 Medicaid - No; Tricare - Yes  [address] [place], [place] [address] [phone] Mon-Fri 8:30-5:00 (lunch 12:00-1:00) Medicaid -Yes; [place] at [place], MD [place], [place], [place] [address] [phone] Mon-Fri 8:00-5:00 Seeing newborns of current patients only. No new patients Medicaid - No, Tricare - yes  Therapist, music at [place], [place]  [address] [place], [place] [address] [phone] Mon-Fri 8:00-5:00 Medicaid -yes as secondary coverage only;  Tricare - yes  [place], [place]; [place], [place]; Albertina Parr, MD; Frederic Jericho, MD; Ronney Lion, MD; [place], [place]; Smoot, NP; Corinna Lines, MD; [place], [address]., [place], [place] [address] [phone] Mon-Fri 8:30-5:00, Sat 9:00-11:00 Accepts commercial insurance ONLY. Offers free prenatal information sessions for families. Medicaid - No, Tricare - Call first  [place], MD; [place], [place]; [place], [place]; [place], [address]., [place] [address] [phone] Mon-Fri 7:30-5:30 Medicaid - Yes; Marchelle Gearing yes  [place] [phone] & [phone])  [facility], [place] [address]., [place], [place] [address] [phone] Mon-Thur 8:00-6:00, closed for lunch 12-2, closed Fridays Medicaid - yes; Tricare - no  Wilkinson, NP; Melford Aase, MD; [place], [place]; [place], [place] [address] [place], [place], [place] [address] [phone] Mon-Fri 7:30-4:30 Medicaid - yes, Tricare - yes  [facility]  Carolynn Sayers, MD; Cristino Martes, NP; Gertie Baron, MD; Debara Pickett, NP [place] [address], [place], [place] [address] [phone] Mon-Fri 8:30-5:00, closed for lunch 1-2, Sat 8:30-12:00 - sick visits only Providers come to see babies at [facility] Only accepting newborns of siblings and first time parents ONLY if who have met with office prior to delivery Medicaid -Yes; Tricare - yes  [place], [place]; Fredderick Severance, NP; Juleen China, MD; Clydene Laming, MD:  [place] [address], [place], [place] [address] [phone] Mon- Fri 8:00-5:00, Sat 9:00-12:00 - sick visits only Accepting siblings of established patients and first time mom/baby Medicaid - Yes; Tricare - yes Patients must have vaccinations (baby vaccines)  [place]west [place] [phone] &  [phone])  [place] at [address]., [place], [place] [address] [phone] Mon-Fri 8:00-5:00 Medicaid - no; Tricare - yes  Schley, MD; [place], [place]; [place], [place] [address], [place], [place] [address] [phone] Mon-Fri 8:00-5:00 Medicaid- yes; Tricare - yes  [place], MD; Ronnald Ramp, NP; [address], [place] [address] [place], [place], [place] [address] [phone] Mon-Fri 8:00-5:00 Medicaid - Yes; Tricare - yes  [address] [place] [phone])  [place], [place]; [place], [place]; Maisie Fus, MD; Charlesetta Garibaldi, MD; Clyde, NP; Isenhour, DO; [place], [place]; Jeannine Kitten, MD; Sheila Oats, MD; Hardin Negus, MD; [place], [place]; [place], [place]; [place], [place] [address] [place], [place], [place] [address] [phone] Mon-Fri 8:30-5:00, Sat 9:00-12:00 - sick only Please register online [email or web address] then schedule online or call office Medicaid-Yes; Tricare -yes  [facility] - Premier  Dabrusco, MD; Delora Fuel, MD; [place], MD; Coffey, NP; [place], [place]; Everette Rank, MD; Radford Pax, NP; Melina Modena, MD [address] [place], [place], [place] [address] [phone] Mon-Fri 8:00-5:30, Sat&Sun by appointment (phones open at 8:30) Medicaid - Yes; Tricare - yes  [place] [phone] & [phone]) [place], CPNP; [place], MD; Araceli Bouche, MD; Jerelene Redden, NP; [place], DO [address], [place], [place] [address] [phone] M-F 8:00 - 5:15, Sat/Sun 9-12 sick visits only Medicaid - No; Tricare - yes  Obert, PA-C; [place], PA-C; Loco, DO; [place], PA-C; Cavour, PA-C; Vassie Moselle, [address]., [place], [place] [address] [phone] Mon-Thur 8:00-7:00, Fri 8:00-5:00 Accepting Medicaid for 13 and under only   [facility]  at [place] ([facility]) [place], [place]; List, FNP; Selinda Eon, MD; [place], PA-C; Scholer,  MD; Modena Nunnery, FNP; Everlean Patterson, FNP; Tempie Donning, MD; Selinda Eon, [place] [address] [place], [place] [address] [phone] Mon-Fri 8:30-5:30 Medicaid - Yes; Tricare - yes  [place], [place]; Rolla Plate, MD; Carola Rhine, MD; Tyron Russell, MD; Dixie Union, NP [address], [place], [place] [address] [phone] Mon-Thur 8:00-5:30, Fri 8:00-5:00, Sat 9:00-12:00 Medicaid - yes, Tricare - yes  [place] [phone])  [place] at [facility] [place], [place]; Olen Pel, MD; [place], [place], [place], [place] [address] [phone] Mon-Fri 8:00-5:00, closed for lunch 12-1 Medicaid - No; [place] - yes  Therapist, music at [facility], [place], [place], [place] [address] [phone] Mon-Fri 8:00-5:00 Medicaid - No; Tricare - yes  [facility], MD; Joelene Millin, MD; Teryl Lucy, MD; Ronnald Ramp, MD [address], [place], [place] [address] [phone] Mon-Fri 8:00-5:00 Medicaid- Yes; Tricare - yes  [place] [phone])  [place] at [facility], [place]; [place], MD [address] [place] [address] [place], [place], [place] [address] [phone] Mon-Fri 8:00-5:00 Medicaid - No; [place] - yes  [place] [address] [place], [place], [place] [address] [phone] Mon-Weds 8:00-6:00, Thurs-Fri 8:00-5:00, Sat 9:00-12:00 Medicaid - yes; Tricare - yes   [place], MD; [place], [place] [address] [place], [place] [address] [phone] Mon-Fri 8:00-5:00 Medicaid - yes; [place] - yes  [facility] Pediatric Providers  [facility] [address] [place], [place] [address] [phone] Gentry Roch: 8am -8pm, Tues, Weds: 8am - 5pm; Fri: 8-1 Medicaid - Yes; Tricare -  yes  [facility] Ilda Mori, MD; Wynetta Emery, MD; Rock Nephew, MD; [place], [place]; [place], [place] [address] [place], [place] [address] [phone] M-F 8:30 - 5:00 Medicaid - Call office; Tricare -yes  [facility] Fredderick Erb, MD; Wynelle Cleveland, MD, Cherylann Banas, MD; Koren Bound, PNP; Terrial Rhodes, [place] [address], [place], [place] [address] [phone] M-F 8:30 - 5:00, Sat/Sun 8:30 - 12:30 (sick visits) Medicaid - Call office; Tricare -yes  [facility] Bobby Rumpf, MD; Wynetta Emery, PNP; Jaynie Crumble, MD; [place], [place]; [place]ieder[place], NP; Orson Aloe [address] [place], [place], [place] [address] [phone] M-F 8:30 - 5:00 Medicaid - Call office; Tricare - yes  [facility] Collene Leyden, MD; Marney Doctor, MD; Melburn Hake, MD; Franki Cabot, MD; Nogo, MD [phone] [address] [place], [place] [address] [phone] M-Thur: 8:00 - 5:00; Fri: 8:00 - 4:00 Medicaid - yes; Tricare - yes  [facility] [address] [place], [place] [address] [phone] M-F: 8:30- 5:00, closed for lunch 12:30 - 1:00 Medicaid - yes; Tricare -yes  [place] [address] [place], [place] [id number] [phone] M-F 8:00 - 5:00 Medicaid - yes; Tricare - yes  Paoli, DO; [place], DO; [place], [place] [address], [place], [place] [address] [phone] M-F 8:00 - 5:00, Closed 12-1 for lunch Medicaid - Call; Tricare - yes  [place], MD [address], [place], [place] [address] [phone] M-F: 8:00-5:00, Sat: 8:00 - noon Medicaid - call; [place] -yes  [facility], FNP-C [address] [place], [place], [place] [address] [phone] M-W: 8:00-5:00, Thur: 8:00 - 7:00, Fri: 8:00 - noon Medicaid - yes; Tricare - yes  [place], Cross [place], [place],  [place] [address] [phone] M-F 8:00 - 5:00, Closed for lunch 12-1 Medicaid -  yes; [place] - yes  [facility] Pediatric Providers  [facility] at [place], [place], Trilby Drummer, MD, [place], [place], [facility], [place], [place], [place] [address] [phone] M-T 8:00-5:00, Wed-Fri 7:00-6:00 Medicaid - Yes; Tricare -yes  [place] at [facility], [place]; [address], [place], [place], [place] [address] [phone] M-F 8:00 - 5:00, closed for lunch 12-1 Medicaid - Yes; Tricare - yes  [facility]  Drema Dallas, MD; Marilynn Rail, MD; Kerby Less, MD; Margaretmary Eddy, MD; Damita Dunnings, MD; Deidre Ala, MD; Arrie Eastern, MD, Leward Quan, MD; Sherren Mocha, MD; Emmit Pomfret, MD; Morton Stall, MD; Clydene Laming, MD [address], [place], [place] [address] [phone] M-F 8:00-5:00 Medicaid - yes; Tricare - yes  [facility] [place], MD (speaks [place] and [place]) [address] [place], [place] [address] [phone] M-F: 8:30 - 5:00, closed 12:30 - 1 for lunch Medicaid - Yes; [place] -yes  [facility] Pediatric Providers  [facility] Delice Lesch, MD; Lindell Noe, MD; Lavone Neri, [place], [place], [place] [address] [phone] M-Th: 8:00 - 5:30, Fri: 8:00 - 12:00 Medicaid - yes; Tricare - yes  [facility] at [facility], NP; Verlee Monte, MD; Nadeen Landau, MD [place] [address], [place], [place] [address] [phone] M-F: 8:00 - 5:00 Medicaid - yes; Tricare - yes  [place], NP; Deon Pilling, NP; Angelica Pou, NP; Manya Silvas, MD; Jimmye Norman, MD, [place], NP, Glo Herring, MD; Cathleen Fears [address] [place], [place] [address] [phone] M-F: 8:30 - 5:30p Medicaid - yes; Tricare - yes Other locations available as well  [facility], MD; Redmond Pulling, MD; Marcine Matar, [place], [place], [place] [address] [phone] M-W: 8:00am - 7:00pm, Thurs: 8:00am - 8:00pm; Fri: 8:00am -  5:00pm, closed daily from 12-1 for lunch Medicaid - yes; [place] - yes  [facility] Pediatric Providers  [place] at Robb Matar, MD; Donella Stade, [place]; Carrolyn Meiers, MD; Johnnette Litter, MD; [place], [place]; Leonie Man; [place], [place]; Alcario Drought, MD;  [address], [place], [place] [address] [phone] Jerilynn Mages - Ludwig [place]: [place], Sat 9-noon Medicaid - Yes; Tricare -yes  [facility] at Smitty Knudsen, MD; Ronnald Ramp, FNP; Sherryle Lis, MD; Teryl Lucy, [place]. Sharyl Nimrod, [phone] M-F 8:00 - 5:00 Medicaid - call; Tricare - yes  [facility]- Lollie Sails, MD; [place], [place]; Lawana Chambers, MD; Ouida Sills, MD; Red Christians, MD; Marzetta Board, MD; Emiliano Dyer; Randolm Idol, MD; Derald Macleod, MD; [place], [place], [place], [place] [address] [phone] M-F 8:00am - 5:00pm; Sat. 9:00 - 11:00 Medicaid - yes; Tricare - yes  [facility] at [facility], MD [address], [place], [place] [address] [phone] M-F 8:00 - 5:00 Medicaid - [place] Medicaid only; Tricare - yes  [facility] - Signa Kell, MD; Rosana Hoes, [place]; Gordan Payment, [place], [place], [place] [address] [phone] M-F 8:00 - 5:00 Medicaid - yes; Tricare - yes  [facility] - [address] Pediatrics - Francine Graven, MD; Owens Shark, MD, [facility], MD, [place], MD; North [place], MD; Tamala Julian, MD; [address] Nyoka Lint [place], [place] [address] [phone] M-F: 8-5 Medicaid - yes; [place] - yes  [place], [place]; [place], MD; [address], [place], [place] [address] [phone] M-F 8-5 Medicaid - yes; Tricare - yes  [address] [place] Joycelyn Rua, MD; Joelene Millin, MD;  [place]-Court[place], MD; Pellam-Palmer, DNP; Bow[place], [place], [place], [place] [address] [phone] M-F 8-5 Medicaid - yes; Tricare - yes  [facility] Danelle Earthly, MD;  Vinson Moselle; Lucie Leather, MD [address] [place], [place] [id number] [phone] M-F 7am - 5 pm Medicaid - call; Tricare - yes  [place], [place]; Louanne Skye, MD; Quentin Cornwall, [place], [place], [place] [address] [phone] M-F 8-5 Medicaid - yes; Tricare - yes  [facility] Pascal Lux, MD; Suzan Slick, MD; Jimmye Norman, [place]; Rolena Infante, [place]; Thornton Papas, [place]; Robinette Haines; [place] [facility] - FNP [address] [place], [place] [address] [phone] M-F 8-5 Medicaid- yes; Tricare - yes  [facility]s - [place] Sellers, Lively and Willa Frater, MD; Dianna Rossetti, MD; Frances Maywood, MD; Nicole Kindred, MD; [place], [place]; Marijean Bravo, MD; Louanne Skye, MD; Benjamine Mola, MD [address] [place], [place] [address] [phone] M-F: 8-5, Sat: 9-4, Sun 9-12 Medicaid - yes; Tricare - yes  [place], PNP; Rosana Hoes, [place] [address] Ny[place] Lint [place], [place] [address] [phone] M-F 8 - 5, closed 12-1 for lunch Medicaid - yes; Tricare - yes  [place], MD; Enid Derry, MD; Benjamine Mola, MD; [place], [place], [place], [place] [address] [phone] M-F 8- 5:30 Medicaid - yes; Tricare - yes  [place]ston, MD; Jeanell Sparrow, MD; Bartholome Bill, [place], [place], [place] [address] [phone] Jerilynn Mages: Sharyne Richters; Tues-Fri: 8-5; Sat: 9-12 Medicaid - yes; Tricare - yes  Signa Kell [facility] - Myrene Buddy, MD; Bjorn Loser, MD; Carlis Abbott, MD; Claudean Kinds, MD; Erik Obey, MD [address] [place], [place] [address] [phone] Jerilynn MagesMarland Kitchen Sharyne RichtersDarrin Luis: 8-5; Sat: 8:30-12:30 Medicaid - yes; Tricare - yes  Dareen Piano [place]mont, MD; [place], [place], [place], [place] [address] [phone] Mon-Fri: 8-5 Medicaid - yes;  Tricare - yes  [facility] - [place] Beasley, CPNP; [place], [place]; Benjamine Mola, MD; Donivan Scull, MD; Noel Journey, MD; [address], [place], [place] [address] [phone] M-F: 8-5, closed 1-2 for lunch Medicaid - yes; Tricare - yes  [place], [place];  Place, [place]; Tamala Julian, MD; [place], CPNP; [place], [place]; Keyes, MD; Juleen China, MD [address] [place], [place], [place] [address] [phone] M-Thurs: Sharyne Richters; Fri: 8-6; Sat: 9-12; Sun 2-4 Medicaid - yes; Tricare - yes  Signa Kell [facility] [place], MD; Vernard Gambles, MD; Carol Ada, FNP; Marjorie Smolder, DO; [address], [place], [place] [address] [phone] M-F: 8-5 Medicaid - yes; [place] - yes  [facility] Pediatric Providers  South [place], MD; [place], [place], [place], [place] [address] [phone] M - Fri: 8am - 5pm, closed for lunch 12-1 Medicaid - Yes; Tricare - yes  [facility] and Rit[place]y, MD; Ovid Curd, MD; Sanger, DO; Vinocur, MD;Hall, PA; Volanda Napoleon, [place]; Megan Salon, NP [phone] [address], [place], [place]  [address] [phone] M-F 8:00 - 5:00, Sat 8:00 - 11:30 Medicaid - yes; Tricare - yes  [facility] Humphrey Rolls, MD; [place], MD, [address], MD, R[place], MD, [place], MD; East [place], NP; [place], [place];  [address], [place],  [address] [phone] M-F 8:10am - 5:00pm Medicaid - yes; Tricare - yes  [place], MD; [place], NP [address] [place], [place] [address] [phone] M-F 8:00 - 5:00 Medicaid - [place] Medicaid only; Tricare - yes  [place], [place]; Convoy, NP [place], [place], [place] [address] [phone] M-F 8:00 - 5:00; Closed for lunch 12 - 1:00 Medicaid - yes;  Tricare - yes  [place], MD; Darius Bump, [place] [place], [place], [place] [address] [phone] Mon 9-5; Tues/Wed 10-5; Thurs 8:30-5; Fri: 8-12:30 Medicaid - yes; Tricare - yes  [facility] Pediatric Providers  [facility]  Bryce, MD; [place], [place] [address], [place], [place] [address] [phone] phone [phone] fax M-F 7:15 - 4:30 Medicaid - yes; Tricare - yes  [place] [facility] Anastasio Champion, MD; [place], DO [address], [place], [place] [address] [phone] M-Fri: 8:30 - 5:00, closed for lunch everyday noon - 1pm Medicaid - Yes; Tricare - yes  [facility] Burdine, MD; Quillian Quince, MD; Nadara Mustard, MD; Quintin Alto, MD; [place], [place]; Antony Blackbird, [place]; , [place]; [place], [place]; [place], [place] [address] [place], [place] [address] [phone] M-Thurs: 7:30am - 7:00pm; Friday 7:30am - 4pm; Sat: 8:00 - 1:00 Medicaid - Yes; Tricare - yes  [place] [facility] of Freida Busman, MD; Lanny Cramp, MD; Janit Bern, MD; [place], [place]. [address], [place], [place], [place] [address] [phone] M-Thur: 8:00 - 5:00, Fri: 8:00 - Noon Medicaid - yes; Tricare - yes No [place] Lockwood Dettinger, MD; Lajuana Ripple, DO; [place], NP; Hassell Done, NP; Lilia Pro, NP; Jeneen Montgomery, NP; Thayer Ohm, NP; Livia Snellen, MD; [place], [place] [address], [place], [place] [address] [phone] M-F 8:00 - 5:00 Medicaid - yes; Tricare - yes  Sebastian Collins, FNP-C; [place], FNP-C [address] Mindi Slicker, [place] [address] [phone] M, W, R 8:00-5:00, Tues: 8:00am - 7:00pm; Fri 8:00 - noon Medicaid - Yes; Tricare - yes  [facility], MD [place], [place] [address] [phone]  M-Thurs 8:30-5:30, Fri: 8:30-12:30pm Medicaid - Yes; Tricare - N

## 2022-06-01 LAB — GLUCOSE TOLERANCE, 2 HOURS W/ 1HR
Glucose, 1 hour: 160 mg/dL (ref 70–179)
Glucose, 2 hour: 144 mg/dL (ref 70–152)
Glucose, Fasting: 79 mg/dL (ref 70–91)

## 2022-06-01 LAB — CBC
Hematocrit: 32.3 % — ABNORMAL LOW (ref 34.0–46.6)
Hemoglobin: 10.8 g/dL — ABNORMAL LOW (ref 11.1–15.9)
MCH: 26.7 pg (ref 26.6–33.0)
MCHC: 33.4 g/dL (ref 31.5–35.7)
MCV: 80 fL (ref 79–97)
Platelets: 348 10*3/uL (ref 150–450)
RBC: 4.04 x10E6/uL (ref 3.77–5.28)
RDW: 12.6 % (ref 11.7–15.4)
WBC: 6.8 10*3/uL (ref 3.4–10.8)

## 2022-06-01 LAB — HIV ANTIBODY (ROUTINE TESTING W REFLEX): HIV Screen 4th Generation wRfx: NONREACTIVE

## 2022-06-01 LAB — RPR: RPR Ser Ql: NONREACTIVE

## 2022-06-03 ENCOUNTER — Encounter: Payer: Self-pay | Admitting: *Deleted

## 2022-06-03 DIAGNOSIS — Z880 Allergy status to penicillin: Secondary | ICD-10-CM | POA: Insufficient documentation

## 2022-06-03 NOTE — Progress Notes (Signed)
   PRENATAL VISIT NOTE  Subjective:  Katherine Ross is a 22 y.o. G1P0 at 29w6dbeing seen today for ongoing prenatal care.  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  Patient reports no complaints.  Contractions: Not present. Vag. Bleeding: None.  Movement: Present. Denies leaking of fluid.   The following portions of the patient's history were reviewed and updated as appropriate: allergies, current medications, past family history, past medical history, past social history, past surgical history and problem list.   Objective:   Vitals:   05/31/22 1119  BP: 138/80  Pulse: (!) 106  Weight: 201 lb 9.6 oz (91.4 kg)    Fetal Status: Fetal Heart Rate (bpm): 145 Fundal Height: 29 cm Movement: Present     General:  Alert, oriented and cooperative. Patient is in no acute distress.  Skin: Skin is warm and dry. No rash noted.   Cardiovascular: Normal heart rate noted  Respiratory: Normal respiratory effort, no problems with respiration noted  Abdomen: Soft, gravid, appropriate for gestational age.  Pain/Pressure: Absent     Pelvic: Cervical exam deferred        Extremities: Normal range of motion.  Edema: None  Mental Status: Normal mood and affect. Normal behavior. Normal judgment and thought content.   Assessment and Plan:  Pregnancy: G1P0 at 271w6d. Encounter for supervision of normal first pregnancy in second trimester - [facility] - recheck 4 weeks - CBC - Glucose Tolerance, 2 Hours w/1 Hour - HIV Antibody (routine testing w rflx) - RPR  2.  Alpha thalassemia silent carrier - FOB declined treatment  3. Low lying placenta nos or without hemorrhage, second trimester - has not had any bleeding.  Discussed some upcoming travel pt wants to do - Repeat ultrasound scheduled 06/20/2022 with MFM  4. 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 Penicillin allergy  Preterm labor symptoms and general obstetric precautions including but not limited to vaginal bleeding, contractions, leaking of fluid and fetal movement were reviewed in detail with the patient. Please refer to After Visit Summary for other counseling recommendations.   Return in about 4 weeks (around 06/28/2022).  Future Appointments  Date Time Provider [place]Aber[place] Gardens3/10/2022  3:30 PM [facility]/10/2022  3:45 PM [facility] US1 [facility]3/11/2022 10:35 AM MiMegan SalonMD [facility]  07/08/2022  9:55 AM MiMegan SalonMD [facility]  07/19/2022 10:35 AM MiMegan SalonMD [facility]  08/02/2022 10:35 AM MiMegan SalonMD [facility]  08/09/2022 10:35 AM MiMegan SalonMD [facility]  08/16/2022 10:15 AM MiMegan SalonMD [facility]  08/23/2022 10:35 AM MiMegan SalonMD [facility]  08/30/2022 10:15 AM MiMegan SalonMD [facility]    MaMegan SalonMD

## 2022-06-20 ENCOUNTER — Other Ambulatory Visit: Payer: Self-pay | Admitting: Obstetrics

## 2022-06-20 ENCOUNTER — Ambulatory Visit: Payer: 59 | Attending: Obstetrics

## 2022-06-20 ENCOUNTER — Ambulatory Visit: Payer: 59

## 2022-06-20 VITALS — BP 133/80 | HR 108

## 2022-06-20 DIAGNOSIS — O285 Abnormal chromosomal and genetic finding on antenatal screening of mother: Secondary | ICD-10-CM | POA: Diagnosis not present

## 2022-06-20 DIAGNOSIS — O99213 Obesity complicating pregnancy, third trimester: Secondary | ICD-10-CM

## 2022-06-20 DIAGNOSIS — Z3A30 30 weeks gestation of pregnancy: Secondary | ICD-10-CM

## 2022-06-20 DIAGNOSIS — O4440 Low lying placenta NOS or without hemorrhage, unspecified trimester: Secondary | ICD-10-CM | POA: Insufficient documentation

## 2022-06-20 DIAGNOSIS — Z362 Encounter for other antenatal screening follow-up: Secondary | ICD-10-CM

## 2022-06-20 DIAGNOSIS — E669 Obesity, unspecified: Secondary | ICD-10-CM | POA: Diagnosis not present

## 2022-06-20 DIAGNOSIS — D563 Thalassemia minor: Secondary | ICD-10-CM

## 2022-06-20 DIAGNOSIS — O99212 Obesity complicating pregnancy, second trimester: Secondary | ICD-10-CM | POA: Diagnosis not present

## 2022-06-20 DIAGNOSIS — O4443 Low lying placenta NOS or without hemorrhage, third trimester: Secondary | ICD-10-CM

## 2022-06-21 ENCOUNTER — Ambulatory Visit (INDEPENDENT_AMBULATORY_CARE_PROVIDER_SITE_OTHER): Payer: 59 | Admitting: Obstetrics & Gynecology

## 2022-06-21 ENCOUNTER — Other Ambulatory Visit: Payer: Self-pay | Admitting: *Deleted

## 2022-06-21 VITALS — BP 133/85 | HR 107 | Wt 206.0 lb

## 2022-06-21 DIAGNOSIS — Z880 Allergy status to penicillin: Secondary | ICD-10-CM

## 2022-06-21 DIAGNOSIS — J4530 Mild persistent asthma, uncomplicated: Secondary | ICD-10-CM

## 2022-06-21 DIAGNOSIS — Z3A30 30 weeks gestation of pregnancy: Secondary | ICD-10-CM

## 2022-06-21 DIAGNOSIS — D563 Thalassemia minor: Secondary | ICD-10-CM

## 2022-06-21 DIAGNOSIS — Z3403 Encounter for supervision of normal first pregnancy, third trimester: Secondary | ICD-10-CM

## 2022-06-21 DIAGNOSIS — O4443 Low lying placenta NOS or without hemorrhage, third trimester: Secondary | ICD-10-CM

## 2022-06-21 DIAGNOSIS — O4440 Low lying placenta NOS or without hemorrhage, unspecified trimester: Secondary | ICD-10-CM

## 2022-06-21 NOTE — Progress Notes (Signed)
   PRENATAL VISIT NOTE  Subjective:  Katherine Ross is a 22 y.o. G1P0 at [redacted]w[redacted]d being seen today for ongoing prenatal care.  She is currently monitored for the following issues for this low-risk pregnancy and has Eczema; Asthma, mild persistent; Class 1 drug-induced obesity without serious comorbidity with body mass index (BMI) of 32.0 to 32.9 in adult; PCOS (polycystic ovarian syndrome); Supervision of normal first pregnancy in third trimester; Alpha thalassemia silent carrier; Low lying placenta nos or without hemorrhage, third trimester; and Penicillin allergy on their problem list.  Patient reports no complaints.  Contractions: Not present. Vag. Bleeding: None.  Movement: Present. Denies leaking of fluid.   Placenta is still low lying.  Denies vaginal bleeding.  Understands may need a c section.  If not, is considering water birth.  Helped pt sign up for it today.  Questions answered.  Will need CNM appt if continues to desire this.  The following portions of the patient's history were reviewed and updated as appropriate: allergies, current medications, past family history, past medical history, past social history, past surgical history and problem list.   Objective:   Vitals:   06/21/22 1045  BP: 133/85  Pulse: (!) 107  Weight: 206 lb (93.4 kg)    Fetal Status: Fetal Heart Rate (bpm): 151   Movement: Present     General:  Alert, oriented and cooperative. Patient is in no acute distress.  Skin: Skin is warm and dry. No rash noted.   Cardiovascular: Normal heart rate noted  Respiratory: Normal respiratory effort, no problems with respiration noted  Abdomen: Soft, gravid, appropriate for gestational age.  Pain/Pressure: Absent     Pelvic: Cervical exam deferred        Extremities: Normal range of motion.  Edema: None  Mental Status: Normal mood and affect. Normal behavior. Normal judgment and thought content.   Assessment and Plan:  Pregnancy: G1P0 at [redacted]w[redacted]d 1. Encounter for  supervision of normal first pregnancy in third trimester - on PNV and baby ASA  2. [redacted] weeks gestation of pregnancy  3. Alpha thalassemia silent carrier - FOB has not been tested  4. Mild persistent asthma without complication - no current issues  5. Low lying placenta nos or without hemorrhage, second trimester - has follow up scan 4 weeks  6. Penicillin allergy  Preterm labor symptoms and general obstetric precautions including but not limited to vaginal bleeding, contractions, leaking of fluid and fetal movement were reviewed in detail with the patient. Please refer to After Visit Summary for other counseling recommendations.   Return in about 2 weeks (around 07/05/2022).  Future Appointments  Date Time Provider [place]  07/08/2022  9:55 AM Megan Salon, MD [facility]  07/19/2022 10:35 AM Megan Salon, MD [facility]  07/19/2022  1:45 PM WMC-M[facility] NURSE [facility]  07/19/2022  2:00 PM [facility]-MFC US1 [facility]  08/02/2022 10:35 AM Megan Salon, MD [facility]  08/09/2022 10:35 AM Megan Salon, MD [facility]  08/16/2022 10:15 AM Megan Salon, MD [facility]  08/23/2022 10:35 AM Megan Salon, MD [facility]  08/29/2022  9:30 AM Mack Hook, MD [place] None  08/30/2022 10:15 AM Megan Salon, MD [facility]    Megan Salon, MD

## 2022-06-24 ENCOUNTER — Encounter (HOSPITAL_BASED_OUTPATIENT_CLINIC_OR_DEPARTMENT_OTHER): Payer: Self-pay | Admitting: *Deleted

## 2022-07-08 ENCOUNTER — Encounter (HOSPITAL_BASED_OUTPATIENT_CLINIC_OR_DEPARTMENT_OTHER): Payer: 59 | Admitting: Obstetrics & Gynecology

## 2022-07-10 ENCOUNTER — Ambulatory Visit (INDEPENDENT_AMBULATORY_CARE_PROVIDER_SITE_OTHER): Payer: 59 | Admitting: Obstetrics & Gynecology

## 2022-07-10 VITALS — BP 126/86 | HR 108 | Wt 211.6 lb

## 2022-07-10 DIAGNOSIS — Z6832 Body mass index (BMI) 32.0-32.9, adult: Secondary | ICD-10-CM

## 2022-07-10 DIAGNOSIS — O99013 Anemia complicating pregnancy, third trimester: Secondary | ICD-10-CM

## 2022-07-10 DIAGNOSIS — D563 Thalassemia minor: Secondary | ICD-10-CM

## 2022-07-10 DIAGNOSIS — Z880 Allergy status to penicillin: Secondary | ICD-10-CM

## 2022-07-10 DIAGNOSIS — J4530 Mild persistent asthma, uncomplicated: Secondary | ICD-10-CM

## 2022-07-10 DIAGNOSIS — Z3403 Encounter for supervision of normal first pregnancy, third trimester: Secondary | ICD-10-CM

## 2022-07-10 DIAGNOSIS — O4443 Low lying placenta NOS or without hemorrhage, third trimester: Secondary | ICD-10-CM

## 2022-07-10 DIAGNOSIS — Z3A33 33 weeks gestation of pregnancy: Secondary | ICD-10-CM

## 2022-07-10 NOTE — Progress Notes (Addendum)
PRENATAL VISIT NOTE  Subjective:  Katherine Ross is a 22 y.o. G1P0 at [redacted]w[redacted]d being seen today for ongoing prenatal care.  She is currently monitored for the following issues for this low-risk pregnancy and has Eczema; Asthma, mild persistent; PCOS (polycystic ovarian syndrome); Supervision of normal first pregnancy in third trimester; Alpha thalassemia silent carrier; Low lying placenta nos or without hemorrhage, third trimester; Penicillin allergy; BMI 32.0-32.9,adult; and Anemia complicating pregnancy, third trimester on their problem list.  Patient reports no complaints.  Contractions: Not present. Vag. Bleeding: None.  Movement: Present. Denies leaking of fluid.   The following portions of the patient's history were reviewed and updated as appropriate: allergies, current medications, past family history, past medical history, past social history, past surgical history and problem list.   Objective:   Vitals:   07/10/22 1508 07/10/22 1512  BP: 126/86 126/86  Pulse:  (!) 108  Weight:  211 lb 9.6 oz (96 kg)    Fetal Status: Fetal Heart Rate (bpm): 136 Fundal Height: 34 cm Movement: Present     General:  Alert, oriented and cooperative. Patient is in no acute distress.  Skin: Skin is warm and dry. No rash noted.   Cardiovascular: Normal heart rate noted  Respiratory: Normal respiratory effort, no problems with respiration noted  Abdomen: Soft, gravid, appropriate for gestational age.  Pain/Pressure: Absent     Pelvic: Cervical exam deferred        Extremities: Normal range of motion.  Edema: Trace  Mental Status: Normal mood and affect. Normal behavior. Normal judgment and thought content.   Assessment and Plan:  Pregnancy: G1P0 at [redacted]w[redacted]d 1. [redacted] weeks gestation of pregnancy - on PNV and baby ASA  2. Supervision of normal first pregnancy in third trimester - rechekc 2 weeks  3. Low lying placenta nos or without hemorrhage, third trimester - pt hoping for trial of water birth.  Has  registered for class.  Aware if placenta stays low, will need c section. Follow up imaging with MFM is scheduled.  4. Penicillin allergy  5. Alpha thalassemia silent carrier - FOB testing offered and declined  6. Mild persistent asthma without complication - stable  7.  Anemia in pregnancy - on oral iron   Preterm labor symptoms and general obstetric precautions including but not limited to vaginal bleeding, contractions, leaking of fluid and fetal movement were reviewed in detail with the patient. Please refer to After Visit Summary for other counseling recommendations.   Return in about 2 weeks (around 07/24/2022).  Future Appointments  Date Time Provider [place]  07/19/2022 10:35 AM Megan Salon, MD [facility]  07/19/2022  1:45 PM WMC-M[facility] NURSE [facility]  07/19/2022  2:00 PM [facility] US1 [facility]  08/02/2022 10:35 AM Megan Salon, MD [facility]  08/09/2022 10:35 AM Megan Salon, MD [facility]  08/16/2022 10:15 AM Megan Salon, MD [facility]  08/23/2022 10:35 AM Megan Salon, MD [facility]  08/29/2022  9:30 AM Mack Hook, MD [place] None  08/30/2022 10:15 AM Megan Salon, MD [facility]    Megan Salon, MD      05/31/2022   12:06 PM 02/06/2022   12:38 PM 03/07/2021   10:04 AM  Depression screen PHQ 2/9  Decreased Interest 0 0 0  Down, Depressed, Hopeless 0 0 0  PHQ - 2 Score 0 0 0  Altered sleeping 0 0 0  Tired, decreased energy 0 0 0  Change in appetite 0  0 0  Feeling bad or failure about yourself  0 0 0  Trouble concentrating 0 0 0  Moving slowly or fidgety/restless 0 0 0  Suicidal thoughts 0 0 0  PHQ-9 Score 0 0 0  Difficult doing work/chores   Not difficult at all

## 2022-07-12 DIAGNOSIS — Z6832 Body mass index (BMI) 32.0-32.9, adult: Secondary | ICD-10-CM | POA: Insufficient documentation

## 2022-07-12 DIAGNOSIS — O99013 Anemia complicating pregnancy, third trimester: Secondary | ICD-10-CM | POA: Insufficient documentation

## 2022-07-19 ENCOUNTER — Ambulatory Visit (INDEPENDENT_AMBULATORY_CARE_PROVIDER_SITE_OTHER): Payer: 59 | Admitting: Obstetrics & Gynecology

## 2022-07-19 ENCOUNTER — Ambulatory Visit: Payer: 59

## 2022-07-19 ENCOUNTER — Other Ambulatory Visit (HOSPITAL_COMMUNITY): Payer: Self-pay

## 2022-07-19 VITALS — BP 133/73 | HR 117 | Wt 210.4 lb

## 2022-07-19 DIAGNOSIS — Z3A34 34 weeks gestation of pregnancy: Secondary | ICD-10-CM | POA: Diagnosis not present

## 2022-07-19 DIAGNOSIS — D563 Thalassemia minor: Secondary | ICD-10-CM

## 2022-07-19 DIAGNOSIS — Z6832 Body mass index (BMI) 32.0-32.9, adult: Secondary | ICD-10-CM

## 2022-07-19 DIAGNOSIS — Z23 Encounter for immunization: Secondary | ICD-10-CM | POA: Diagnosis not present

## 2022-07-19 DIAGNOSIS — L309 Dermatitis, unspecified: Secondary | ICD-10-CM

## 2022-07-19 DIAGNOSIS — O4443 Low lying placenta NOS or without hemorrhage, third trimester: Secondary | ICD-10-CM

## 2022-07-19 DIAGNOSIS — O99013 Anemia complicating pregnancy, third trimester: Secondary | ICD-10-CM

## 2022-07-19 DIAGNOSIS — J4530 Mild persistent asthma, uncomplicated: Secondary | ICD-10-CM

## 2022-07-19 DIAGNOSIS — Z880 Allergy status to penicillin: Secondary | ICD-10-CM

## 2022-07-19 DIAGNOSIS — Z3403 Encounter for supervision of normal first pregnancy, third trimester: Secondary | ICD-10-CM

## 2022-07-19 MED ORDER — ALBUTEROL SULFATE HFA 108 (90 BASE) MCG/ACT IN AERS
2.0000 | INHALATION_SPRAY | RESPIRATORY_TRACT | 1 refills | Status: DC | PRN
  Filled 2022-07-19: qty 6.7, 17d supply, fill #0

## 2022-07-19 MED ORDER — ALBUTEROL SULFATE HFA 108 (90 BASE) MCG/ACT IN AERS: 2.0000 | INHALATION_SPRAY | RESPIRATORY_TRACT | 1 refills | Status: DC | PRN

## 2022-07-20 NOTE — Progress Notes (Signed)
PRENATAL VISIT NOTE  Subjective:  Katherine Ross is a 22 y.o. G1P0 at [redacted]w[redacted]d being seen today for ongoing prenatal care.  She is currently monitored for the following issues for this low-risk pregnancy and has Eczema; Asthma, mild persistent; PCOS (polycystic ovarian syndrome); Supervision of normal first pregnancy in third trimester; Alpha thalassemia silent carrier; Low lying placenta nos or without hemorrhage, third trimester; Penicillin allergy; BMI 32.0-32.9,adult; and Anemia complicating pregnancy, third trimester on their problem list.  Patient reports  some increased symptoms with her asthma and mild SOB/wheezing with the pollen.  Out of albuterol inhaler and needs RF .  Contractions: Not present. Vag. Bleeding: None.  Movement: Present. Denies leaking of fluid.   The following portions of the patient's history were reviewed and updated as appropriate: allergies, current medications, past family history, past medical history, past social history, past surgical history and problem list.   Objective:   Vitals:   07/19/22 1101  BP: 133/73  Pulse: (!) 117  Weight: 210 lb 6.4 oz (95.4 kg)    Fetal Status: Fetal Heart Rate (bpm): 134 Fundal Height: 37 cm Movement: Present     General:  Alert, oriented and cooperative. Patient is in no acute distress.  Skin: Skin is warm and dry. No rash noted.   Cardiovascular: Normal heart rate noted  Respiratory: Normal respiratory effort, no problems with respiration noted  Abdomen: Soft, gravid, appropriate for gestational age.  Pain/Pressure: Absent     Pelvic: Cervical exam deferred        Extremities: Normal range of motion.  Edema: None  Mental Status: Normal mood and affect. Normal behavior. Normal judgment and thought content.   Assessment and Plan:  Pregnancy: G1P0 at [redacted]w[redacted]d 1. Supervision of normal first pregnancy in third trimester - on PNV and baby ASA - recheck 2 weeks - she is interested in a water birth but missed the class she was  registered for this week.  Helped her register again today.  She is aware if placenta does not move, she will need a cesarean section for delivery  2. [redacted] weeks gestation of pregnancy  3. Mild persistent asthma without complication - rx for albuterol inhaler to pharmacy  4. Low lying placenta nos or without hemorrhage, third trimester - has MFM follow up scheduled 07/23/2022  5. Alpha thalas[place]ia silent carrier [place]  6. BMI 32.0-32.9,adult  7. Anemia complicating pregnancy, third trimester - on oral iron  8. Penicillin allergy  9. Eczema, unspecified type  - albuterol (PROAIR HFA) 108 (90 Base) MCG/ACT inhaler; Inhale 2 puffs into the lungs every 4 (four) hours as needed for wheezing or shortness of breath.  Dispense: 6.7 g; Refill: 1  Preterm labor symptoms and general obstetric precautions including but not limited to vaginal bleeding, contractions, leaking of fluid and fetal movement were reviewed in detail with the patient. Please refer to After Visit Summary for other counseling recommendations.   Return in about 2 weeks (around 08/02/2022).  Future Appointments  Date Time Provider Department Center  07/23/2022 10:30 AM [facility] NURSE [facility]  07/23/2022 10:45 AM WMC-MFC US4 [facility]  08/02/2022 10:35 AM Jerene Bears, MD [facility]  08/09/2022 10:35 AM Jerene Bears, MD [facility]  08/16/2022 10:15 AM Jerene Bears, MD [facility]  08/23/2022 10:35 AM Jerene Bears, MD [facility]  08/29/2022  9:30 AM Julieanne Manson, MD [facility] None  08/30/2022 10:15 AM Jerene Bears, MD  [facility]    Jerene Bears, MD

## 2022-07-23 ENCOUNTER — Ambulatory Visit: Payer: 59 | Attending: Obstetrics & Gynecology | Admitting: *Deleted

## 2022-07-23 ENCOUNTER — Ambulatory Visit (HOSPITAL_BASED_OUTPATIENT_CLINIC_OR_DEPARTMENT_OTHER): Payer: 59

## 2022-07-23 VITALS — BP 132/72 | HR 113

## 2022-07-23 DIAGNOSIS — O285 Abnormal chromosomal and genetic finding on antenatal screening of mother: Secondary | ICD-10-CM | POA: Diagnosis not present

## 2022-07-23 DIAGNOSIS — O99213 Obesity complicating pregnancy, third trimester: Secondary | ICD-10-CM | POA: Insufficient documentation

## 2022-07-23 DIAGNOSIS — D563 Thalassemia minor: Secondary | ICD-10-CM | POA: Diagnosis not present

## 2022-07-23 DIAGNOSIS — E669 Obesity, unspecified: Secondary | ICD-10-CM | POA: Diagnosis not present

## 2022-07-23 DIAGNOSIS — Z3A35 35 weeks gestation of pregnancy: Secondary | ICD-10-CM

## 2022-07-23 DIAGNOSIS — O4440 Low lying placenta NOS or without hemorrhage, unspecified trimester: Secondary | ICD-10-CM

## 2022-07-23 DIAGNOSIS — Z148 Genetic carrier of other disease: Secondary | ICD-10-CM | POA: Diagnosis not present

## 2022-08-02 ENCOUNTER — Ambulatory Visit (INDEPENDENT_AMBULATORY_CARE_PROVIDER_SITE_OTHER): Payer: 59 | Admitting: Advanced Practice Midwife

## 2022-08-02 ENCOUNTER — Encounter (HOSPITAL_BASED_OUTPATIENT_CLINIC_OR_DEPARTMENT_OTHER): Payer: Self-pay | Admitting: Advanced Practice Midwife

## 2022-08-02 ENCOUNTER — Other Ambulatory Visit (HOSPITAL_COMMUNITY): Payer: Self-pay

## 2022-08-02 ENCOUNTER — Other Ambulatory Visit (HOSPITAL_COMMUNITY)
Admission: RE | Admit: 2022-08-02 | Discharge: 2022-08-02 | Disposition: A | Discharge status: Home / Self Care | Payer: 59 | Source: Ambulatory Visit | Attending: Obstetrics & Gynecology | Admitting: Obstetrics & Gynecology

## 2022-08-02 VITALS — BP 128/86 | HR 123 | Wt 211.0 lb

## 2022-08-02 DIAGNOSIS — Z3403 Encounter for supervision of normal first pregnancy, third trimester: Secondary | ICD-10-CM | POA: Diagnosis not present

## 2022-08-02 DIAGNOSIS — Z3A36 36 weeks gestation of pregnancy: Secondary | ICD-10-CM

## 2022-08-02 DIAGNOSIS — R030 Elevated blood-pressure reading, without diagnosis of hypertension: Secondary | ICD-10-CM

## 2022-08-02 DIAGNOSIS — J4531 Mild persistent asthma with (acute) exacerbation: Secondary | ICD-10-CM

## 2022-08-02 DIAGNOSIS — J4530 Mild persistent asthma, uncomplicated: Secondary | ICD-10-CM

## 2022-08-02 LAB — COMPREHENSIVE METABOLIC PANEL
ALT: 12 IU/L (ref 0–32)
AST: 15 IU/L (ref 0–40)
Albumin/Globulin Ratio: 1.4 (ref 1.2–2.2)
Albumin: 3.7 g/dL — ABNORMAL LOW (ref 4.0–5.0)
Alkaline Phosphatase: 123 IU/L — ABNORMAL HIGH (ref 44–121)
BUN/Creatinine Ratio: 4 — ABNORMAL LOW (ref 9–23)
BUN: 2 mg/dL — ABNORMAL LOW (ref 6–20)
Bilirubin Total: <0.2 mg/dL (ref 0.0–1.2)
CO2: 15 mmol/L — ABNORMAL LOW (ref 20–29)
Calcium: 9.1 mg/dL (ref 8.7–10.2)
Chloride: 104 mmol/L (ref 96–106)
Creatinine, Ser: 0.53 mg/dL — ABNORMAL LOW (ref 0.57–1.00)
Globulin, Total: 2.6 g/dL (ref 1.5–4.5)
Glucose: 178 mg/dL — ABNORMAL HIGH (ref 70–99)
Potassium: 3.7 mmol/L (ref 3.5–5.2)
Sodium: 136 mmol/L (ref 134–144)
Total Protein: 6.3 g/dL (ref 6.0–8.5)
eGFR: 133 mL/min/{1.73_m2} (ref >59)

## 2022-08-02 LAB — CBC
Hematocrit: 32.4 % — ABNORMAL LOW (ref 34.0–46.6)
Hemoglobin: 10.9 g/dL — ABNORMAL LOW (ref 11.1–15.9)
MCH: 26.7 pg (ref 26.6–33.0)
MCHC: 33.6 g/dL (ref 31.5–35.7)
MCV: 79 fL (ref 79–97)
Platelets: 336 10*3/uL (ref 150–450)
RBC: 4.09 x10E6/uL (ref 3.77–5.28)
RDW: 12.7 % (ref 11.7–15.4)
WBC: 5 10*3/uL (ref 3.4–10.8)

## 2022-08-02 MED ORDER — FLUTICASONE PROPIONATE HFA 44 MCG/ACT IN AERO
2.0000 | INHALATION_SPRAY | Freq: Two times a day (BID) | RESPIRATORY_TRACT | 0 refills | Status: DC
Start: 2022-08-02 — End: 2022-10-22
  Filled 2022-08-02: qty 10.6, 30d supply, fill #0

## 2022-08-02 MED ORDER — FLUTICASONE PROPIONATE HFA 44 MCG/ACT IN AERO
2.0000 | INHALATION_SPRAY | Freq: Two times a day (BID) | RESPIRATORY_TRACT | 0 refills | Status: DC
Start: 2022-08-02 — End: 2022-08-02

## 2022-08-02 NOTE — Progress Notes (Signed)
   PRENATAL VISIT NOTE  Subjective:  Katherine Ross is a 22 y.o. G1P0 at [redacted]w[redacted]d being seen today for ongoing prenatal care.  She is currently monitored for the following issues for this low-risk pregnancy and has Eczema; Asthma, mild persistent; PCOS (polycystic ovarian syndrome); Supervision of normal first pregnancy in third trimester; Alpha thalassemia silent carrier; Low lying placenta nos or without hemorrhage, third trimester; Penicillin allergy; BMI 32.0-32.9,adult; and Anemia complicating pregnancy, third trimester on their problem list.  Patient reports  asthma exacerbation, SOB x 2 weeks, worse with pollen/allergies .  Contractions: Not present. Vag. Bleeding: None.  Movement: Present. Denies leaking of fluid.   The following portions of the patient's history were reviewed and updated as appropriate: allergies, current medications, past family history, past medical history, past social history, past surgical history and problem list.   Objective:   Vitals:   08/02/22 1135 08/02/22 1136  BP: (!) 139/102 128/86  Pulse: (!) 123   Weight: 211 lb (95.7 kg)     Fetal Status: Fetal Heart Rate (bpm): 140 Fundal Height: 36 cm Movement: Present     General:  Alert, oriented and cooperative. Patient is in no acute distress.  Skin: Skin is warm and dry. No rash noted.   Cardiovascular: Normal heart rate noted  Respiratory: Normal respiratory effort, no problems with respiration noted  Abdomen: Soft, gravid, appropriate for gestational age.  Pain/Pressure: Present     Pelvic: Cervical exam deferred        Extremities: Normal range of motion.  Edema: Trace  Mental Status: Normal mood and affect. Normal behavior. Normal judgment and thought content.   Assessment and Plan:  Pregnancy: G1P0 at [redacted]w[redacted]d 1. Supervision of normal first pregnancy in third trimester --Anticipatory guidance about next visits/weeks of pregnancy given.  - Cervicovaginal ancillary only( [place]) - Culture, beta strep  (group b only)  2. Elevated blood pressure reading without diagnosis of hypertension --Retake wnl, BP check on Monday --No s/sx of PEC, warning signs reviewed  - CBC - Comp Met (CMET) - Protein / creatinine ratio, urine  3. Mild persistent asthma, uncomplicated   4. Mild persistent allergic asthma with acute exacerbation --Allergy symptoms adding to exacerbation --Zyrtec or Claritin daily, add steroid inhaler BID during exacerbation  - fluticasone (FLOVENT HFA) 44 MCG/ACT inhaler; Inhale 2 puffs into the lungs 2 (two) times daily.  Dispense: 1 each; Refill: 0  5. [redacted] weeks gestation of pregnancy   Preterm labor symptoms and general obstetric precautions including but not limited to vaginal bleeding, contractions, leaking of fluid and fetal movement were reviewed in detail with the patient. Please refer to After Visit Summary for other counseling recommendations.   Return for Add BP check virtual visit on 4/22.  Future Appointments  Date Time Provider Department Center  08/09/2022 10:35 AM Jerene Bears, MD [facility]  08/16/2022 10:15 AM Jerene Bears, MD [facility]  08/23/2022 10:35 AM Jerene Bears, MD [facility]  08/29/2022  9:30 AM Julieanne Manson, MD [facility] None  08/30/2022 10:15 AM Jerene Bears, MD [facility]    Sharen Counter, CNM

## 2022-08-03 LAB — PROTEIN / CREATININE RATIO, URINE
Creatinine, Urine: 35.2 mg/dL
Protein, Ur: 6.3 mg/dL
Protein/Creat Ratio: 179 mg/g creat (ref 0–200)

## 2022-08-05 LAB — CERVICOVAGINAL ANCILLARY ONLY
Chlamydia: NEGATIVE
Comment: NEGATIVE
Comment: NORMAL
Neisseria Gonorrhea: NEGATIVE

## 2022-08-06 LAB — CULTURE, BETA STREP (GROUP B ONLY): Strep Gp B Culture: NEGATIVE

## 2022-08-09 ENCOUNTER — Encounter (HOSPITAL_BASED_OUTPATIENT_CLINIC_OR_DEPARTMENT_OTHER): Payer: Self-pay | Admitting: Obstetrics & Gynecology

## 2022-08-09 ENCOUNTER — Ambulatory Visit (INDEPENDENT_AMBULATORY_CARE_PROVIDER_SITE_OTHER): Payer: 59 | Admitting: Obstetrics & Gynecology

## 2022-08-09 VITALS — BP 136/89 | HR 124 | Wt 210.2 lb

## 2022-08-09 DIAGNOSIS — O99013 Anemia complicating pregnancy, third trimester: Secondary | ICD-10-CM

## 2022-08-09 DIAGNOSIS — Z3403 Encounter for supervision of normal first pregnancy, third trimester: Secondary | ICD-10-CM

## 2022-08-09 DIAGNOSIS — J4530 Mild persistent asthma, uncomplicated: Secondary | ICD-10-CM

## 2022-08-09 DIAGNOSIS — O4443 Low lying placenta NOS or without hemorrhage, third trimester: Secondary | ICD-10-CM

## 2022-08-09 DIAGNOSIS — Z3A37 37 weeks gestation of pregnancy: Secondary | ICD-10-CM

## 2022-08-09 DIAGNOSIS — D563 Thalassemia minor: Secondary | ICD-10-CM

## 2022-08-09 DIAGNOSIS — Z880 Allergy status to penicillin: Secondary | ICD-10-CM

## 2022-08-09 DIAGNOSIS — Z6832 Body mass index (BMI) 32.0-32.9, adult: Secondary | ICD-10-CM

## 2022-08-11 NOTE — Progress Notes (Signed)
   PRENATAL VISIT NOTE  Subjective:  Katherine Ross is a 22 y.o. G1P0 at [redacted]w[redacted]d being seen today for ongoing prenatal care.  She is currently monitored for the following issues for this low-risk pregnancy and has Eczema; Asthma, mild persistent; PCOS (polycystic ovarian syndrome); Supervision of normal first pregnancy in third trimester; Alpha thalassemia silent carrier; Low lying placenta nos or without hemorrhage, third trimester; Penicillin allergy; BMI 32.0-32.9,adult; and Anemia complicating pregnancy, third trimester on their problem list.  Patient reports no complaints.  Contractions: Not present. Vag. Bleeding: None.  Movement: Present. Denies leaking of fluid.   Last ultrasound with MFM on 4/9 showed resolution of low lying placenta.  Pt ok for trial of labor.  Does want water birth.  Has done class now.  Has certificate which will be printed and scanned into EPIC.  The following portions of the patient's history were reviewed and updated as appropriate: allergies, current medications, past family history, past medical history, past social history, past surgical history and problem list.   Objective:   Vitals:   08/09/22 1053  BP: 136/89  Pulse: (!) 124  Weight: 210 lb 3.2 oz (95.3 kg)    Fetal Status: Fetal Heart Rate (bpm): 135 Fundal Height: 37 cm Movement: Present  Presentation: Vertex  General:  Alert, oriented and cooperative. Patient is in no acute distress.  Skin: Skin is warm and dry. No rash noted.   Cardiovascular: Normal heart rate noted  Respiratory: Normal respiratory effort, no problems with respiration noted  Abdomen: Soft, gravid, appropriate for gestational age.  Pain/Pressure: Absent     Pelvic: Cervical exam deferred        Extremities: Normal range of motion.  Edema: None  Mental Status: Normal mood and affect. Normal behavior. Normal judgment and thought content.   Assessment and Plan:  Pregnancy: G1P0 at [redacted]w[redacted]d 1. Supervision of normal first pregnancy in  third trimester - on PNV and baby ASA  2. [redacted] weeks gestation of pregnancy - recheck 1 week.  Will adjust appts for her to see CNM  3. Low lying placenta nos or without hemorrhage, third trimester - resolved with 07/23/2022 imaging  4. BMI 32.0-32.9,adult - normal gestational diabetes tesitng  5. Mild persistent asthma without complication - stable in pregnancy  6. Anemia complicating pregnancy, third trimester  - on oral iron  7. Alpha thalassemia silent carrier - FOB not tested  8. Penicillin allergy  Term labor symptoms and general obstetric precautions including but not limited to vaginal bleeding, contractions, leaking of fluid and fetal movement were reviewed in detail with the patient. Please refer to After Visit Summary for other counseling recommendations.   Return in about 1 week (around 08/16/2022).  Future Appointments  Date Time Provider Department Center  08/13/2022  2:55 PM Kendell Bane [facility]  08/23/2022 10:35 AM Jerene Bears, MD [facility]  08/29/2022  9:30 AM Julieanne Manson, MD [facility] None  08/30/2022 10:15 AM Jerene Bears, MD [facility]    Jerene Bears, MD

## 2022-08-13 ENCOUNTER — Ambulatory Visit (INDEPENDENT_AMBULATORY_CARE_PROVIDER_SITE_OTHER): Payer: 59 | Admitting: Advanced Practice Midwife

## 2022-08-13 VITALS — BP 132/89 | HR 108 | Wt 212.0 lb

## 2022-08-13 DIAGNOSIS — Z3A38 38 weeks gestation of pregnancy: Secondary | ICD-10-CM

## 2022-08-13 DIAGNOSIS — Z3403 Encounter for supervision of normal first pregnancy, third trimester: Secondary | ICD-10-CM

## 2022-08-13 NOTE — Progress Notes (Signed)
   PRENATAL VISIT NOTE  Subjective:  Katherine Ross is a 22 y.o. G1P0 at [redacted]w[redacted]d being seen today for ongoing prenatal care.  She is currently monitored for the following issues for this low-risk pregnancy and has Eczema; Asthma, mild persistent; PCOS (polycystic ovarian syndrome); Supervision of normal first pregnancy in third trimester; Alpha thalassemia silent carrier; Low lying placenta nos or without hemorrhage, third trimester; Penicillin allergy; BMI 32.0-32.9,adult; and Anemia complicating pregnancy, third trimester on their problem list.  Patient reports occasional contractions.  Contractions: Irregular. Vag. Bleeding: None.  Movement: Present. Denies leaking of fluid.   The following portions of the patient's history were reviewed and updated as appropriate: allergies, current medications, past family history, past medical history, past social history, past surgical history and problem list.   Objective:   Vitals:   08/13/22 1548  BP: 132/89  Pulse: (!) 108  Weight: 212 lb (96.2 kg)    Fetal Status: Fetal Heart Rate (bpm): 135   Movement: Present     General:  Alert, oriented and cooperative. Patient is in no acute distress.  Skin: Skin is warm and dry. No rash noted.   Cardiovascular: Normal heart rate noted  Respiratory: Normal respiratory effort, no problems with respiration noted  Abdomen: Soft, gravid, appropriate for gestational age.  Pain/Pressure: Absent     Pelvic: Cervical exam deferred        Extremities: Normal range of motion.  Edema: None  Mental Status: Normal mood and affect. Normal behavior. Normal judgment and thought content.   Assessment and Plan:  Pregnancy: G1P0 at [redacted]w[redacted]d 1. Supervision of normal first pregnancy in third trimester --Anticipatory guidance about next visits/weeks of pregnancy given.  - Pt interested in waterbirth and has attended the class.  - Reviewed conditions in labor that will risk her out of water immersion including thick meconium or  blood stained amniotic fluid, non-reassuring fetal status on monitor, excessive bleeding, hypertension, dizziness, use of IV meds, damaged equipment or staffing that does not allow for water immersion, etc.  - The attending midwife must be on the unit for water immersion to begin; pt understands this may delay the start of water immersion.  - Discussed other labor support options if waterbirth becomes unavailable, including position change, freedom of movement, use of birthing ball, and/or use of hydrotherapy in the shower (dependent upon medical condition/provider discretion).    2. [redacted] weeks gestation of pregnancy   Term labor symptoms and general obstetric precautions including but not limited to vaginal bleeding, contractions, leaking of fluid and fetal movement were reviewed in detail with the patient. Please refer to After Visit Summary for other counseling recommendations.   Return in about 1 week (around 08/20/2022) for As scheduled.  Future Appointments  Date Time Provider Department Center  08/23/2022 10:35 AM Jerene Bears, MD [facility]  08/29/2022  9:30 AM Julieanne Manson, MD [facility] None  08/30/2022 10:15 AM Jerene Bears, MD [facility]    Sharen Counter, CNM

## 2022-08-16 ENCOUNTER — Encounter (HOSPITAL_BASED_OUTPATIENT_CLINIC_OR_DEPARTMENT_OTHER): Payer: 59 | Admitting: Family Medicine

## 2022-08-23 ENCOUNTER — Ambulatory Visit (INDEPENDENT_AMBULATORY_CARE_PROVIDER_SITE_OTHER): Payer: 59 | Admitting: Obstetrics & Gynecology

## 2022-08-23 VITALS — BP 129/77 | HR 114 | Wt 216.2 lb

## 2022-08-23 DIAGNOSIS — Z3A39 39 weeks gestation of pregnancy: Secondary | ICD-10-CM

## 2022-08-23 DIAGNOSIS — O99013 Anemia complicating pregnancy, third trimester: Secondary | ICD-10-CM

## 2022-08-23 DIAGNOSIS — D563 Thalassemia minor: Secondary | ICD-10-CM

## 2022-08-23 DIAGNOSIS — Z3403 Encounter for supervision of normal first pregnancy, third trimester: Secondary | ICD-10-CM

## 2022-08-23 DIAGNOSIS — Z880 Allergy status to penicillin: Secondary | ICD-10-CM

## 2022-08-23 DIAGNOSIS — O4443 Low lying placenta NOS or without hemorrhage, third trimester: Secondary | ICD-10-CM

## 2022-08-23 DIAGNOSIS — J4530 Mild persistent asthma, uncomplicated: Secondary | ICD-10-CM

## 2022-08-24 ENCOUNTER — Other Ambulatory Visit (HOSPITAL_BASED_OUTPATIENT_CLINIC_OR_DEPARTMENT_OTHER): Payer: Self-pay | Admitting: Obstetrics & Gynecology

## 2022-08-24 DIAGNOSIS — Z3490 Encounter for supervision of normal pregnancy, unspecified, unspecified trimester: Secondary | ICD-10-CM

## 2022-08-24 NOTE — Progress Notes (Signed)
   PRENATAL VISIT NOTE  Subjective:  Katherine Ross is a 22 y.o. G1P0 at [redacted]w[redacted]d being seen today for ongoing prenatal care.  She is currently monitored for the following issues for this low-risk pregnancy and has Eczema; Asthma, mild persistent; PCOS (polycystic ovarian syndrome); Supervision of normal first pregnancy in third trimester; Alpha thalassemia silent carrier; Low lying placenta nos or without hemorrhage, third trimester; Penicillin allergy; BMI 32.0-32.9,adult; and Anemia complicating pregnancy, third trimester on their problem list.  Patient reports no complaints.  Contractions: Irregular. Vag. Bleeding: None.  Movement: Present. Denies leaking of fluid.   The following portions of the patient's history were reviewed and updated as appropriate: allergies, current medications, past family history, past medical history, past social history, past surgical history and problem list.   Objective:   Vitals:   08/23/22 1045  BP: 129/77  Pulse: (!) 114  Weight: 216 lb 3.2 oz (98.1 kg)    Fetal Status: Fetal Heart Rate (bpm): 136 Fundal Height: 38 cm Movement: Present  Presentation: Vertex  General:  Alert, oriented and cooperative. Patient is in no acute distress.  Skin: Skin is warm and dry. No rash noted.   Cardiovascular: Normal heart rate noted  Respiratory: Normal respiratory effort, no problems with respiration noted  Abdomen: Soft, gravid, appropriate for gestational age.  Pain/Pressure: Absent     Pelvic: Cervical exam performed in the presence of a chaperone Dilation: 2 Effacement (%): 50 Station: -3  Extremities: Normal range of motion.  Edema: Trace  Mental Status: Normal mood and affect. Normal behavior. Normal judgment and thought content.   Assessment and Plan:  Pregnancy: G1P0 at [redacted]w[redacted]d 1. Supervision of normal first pregnancy in third trimester - on PNV - discussing timing of delivery if gets past due date.  She would really like to try water birth.  Recommend  induction at 31 weeks and NSTs/BPP between 40 and 41 weeks.  Reasoning discussed.  Pt comfortable.  Induction will be scheduled.  2. [redacted] weeks gestation of pregnancy - on PNV  3. Anemia complicating pregnancy, third trimester - on iron - hb 10.9 on 08/02/2022  4. Mild persistent asthma without complication - not needing albuterol inhaler right now  5. Alpha thalassemia silent carrier - FOB not tested  6. Low lying placenta nos or without hemorrhage, third trimester - resolved with 4/9 u/s  7. Penicillin allergy - GBS negative  Term labor symptoms and general obstetric precautions including but not limited to vaginal bleeding, contractions, leaking of fluid and fetal movement were reviewed in detail with the patient. Please refer to After Visit Summary for other counseling recommendations.   Return in about 1 week (around 08/30/2022).  Future Appointments  Date Time Provider Department Center  08/29/2022  1:15 PM [facility]  08/30/2022 10:15 AM Jerene Bears, MD [facility]  09/05/2022  7:00 AM [facility] SCHED ROOM [facility] None    Jerene Bears, MD

## 2022-08-28 ENCOUNTER — Encounter (HOSPITAL_COMMUNITY): Payer: Self-pay

## 2022-08-28 ENCOUNTER — Telehealth (HOSPITAL_COMMUNITY): Payer: Self-pay | Admitting: *Deleted

## 2022-08-28 NOTE — Telephone Encounter (Signed)
Preadmission screen  

## 2022-08-29 ENCOUNTER — Other Ambulatory Visit (INDEPENDENT_AMBULATORY_CARE_PROVIDER_SITE_OTHER): Payer: 59

## 2022-08-29 ENCOUNTER — Other Ambulatory Visit: Payer: Self-pay

## 2022-08-29 ENCOUNTER — Ambulatory Visit: Payer: Self-pay | Admitting: Internal Medicine

## 2022-08-29 ENCOUNTER — Ambulatory Visit: Payer: 59 | Admitting: *Deleted

## 2022-08-29 VITALS — BP 133/82 | HR 123 | Wt 217.8 lb

## 2022-08-29 DIAGNOSIS — O480 Post-term pregnancy: Secondary | ICD-10-CM

## 2022-08-29 DIAGNOSIS — Z3A40 40 weeks gestation of pregnancy: Secondary | ICD-10-CM | POA: Diagnosis not present

## 2022-08-30 ENCOUNTER — Other Ambulatory Visit: Payer: Self-pay | Admitting: Advanced Practice Midwife

## 2022-08-30 ENCOUNTER — Ambulatory Visit (INDEPENDENT_AMBULATORY_CARE_PROVIDER_SITE_OTHER): Payer: 59 | Admitting: Obstetrics & Gynecology

## 2022-08-30 ENCOUNTER — Telehealth (HOSPITAL_COMMUNITY): Payer: Self-pay | Admitting: *Deleted

## 2022-08-30 VITALS — BP 125/88 | HR 120 | Wt 216.8 lb

## 2022-08-30 DIAGNOSIS — Z880 Allergy status to penicillin: Secondary | ICD-10-CM | POA: Diagnosis not present

## 2022-08-30 DIAGNOSIS — J4530 Mild persistent asthma, uncomplicated: Secondary | ICD-10-CM | POA: Diagnosis not present

## 2022-08-30 DIAGNOSIS — Z3A40 40 weeks gestation of pregnancy: Secondary | ICD-10-CM | POA: Diagnosis not present

## 2022-08-30 DIAGNOSIS — D563 Thalassemia minor: Secondary | ICD-10-CM | POA: Diagnosis not present

## 2022-08-30 DIAGNOSIS — O4443 Low lying placenta NOS or without hemorrhage, third trimester: Secondary | ICD-10-CM

## 2022-08-30 DIAGNOSIS — Z3403 Encounter for supervision of normal first pregnancy, third trimester: Secondary | ICD-10-CM

## 2022-08-30 DIAGNOSIS — O99013 Anemia complicating pregnancy, third trimester: Secondary | ICD-10-CM

## 2022-08-30 NOTE — Progress Notes (Signed)
   PRENATAL VISIT NOTE  Subjective:  Katherine Ross is a 22 y.o. G1P0 at [redacted]w[redacted]d being seen today for ongoing prenatal care.  She is currently monitored for the following issues for this low-risk pregnancy and has Eczema; Asthma, mild persistent; PCOS (polycystic ovarian syndrome); Supervision of normal first pregnancy in third trimester; Alpha thalassemia silent carrier; Low lying placenta nos or without hemorrhage, third trimester; Penicillin allergy; BMI 32.0-32.9,adult; and Anemia complicating pregnancy, third trimester on their problem list.  Patient reports  she's getting tired.  Ready for baby to come .  Contractions: Irritability. Vag. Bleeding: None.  Movement: Present. Denies leaking of fluid.   The following portions of the patient's history were reviewed and updated as appropriate: allergies, current medications, past family history, past medical history, past social history, past surgical history and problem list.   Objective:   Vitals:   08/30/22 1023  BP: 125/88  Pulse: (!) 120  Weight: 216 lb 12.8 oz (98.3 kg)    Fetal Status: Fetal Heart Rate (bpm): 148 Fundal Height: 38 cm Movement: Present     General:  Alert, oriented and cooperative. Patient is in no acute distress.  Skin: Skin is warm and dry. No rash noted.   Cardiovascular: Normal heart rate noted  Respiratory: Normal respiratory effort, no problems with respiration noted  Abdomen: Soft, gravid, appropriate for gestational age.  Pain/Pressure: Present     Pelvic: Cervical exam performed in the presence of a chaperone Dilation: 3 Effacement (%): 50 Station: -3  Extremities: Normal range of motion.  Edema: Trace  Mental Status: Normal mood and affect. Normal behavior. Normal judgment and thought content.   Assessment and Plan:  Pregnancy: G1P0 at [redacted]w[redacted]d 1. Supervision of normal first pregnancy in third trimester - on PNV and baby ASA  2. [redacted] weeks gestation of pregnancy - induction scheduled for 5/22.  Had discussed  foley bulb placement but she will not need this given cervical exam today.  3. Alpha thalassemia silent carrier - FOB not tested  4. Anemia complicating pregnancy, third trimester - taking iron  5. Mild persistent asthma without complication - stable  6. Low lying placenta nos or without hemorrhage, third trimester - resolved 07/23/2022  7. Penicillin allergy - and GBS negative   Term labor symptoms and general obstetric precautions including but not limited to vaginal bleeding, contractions, leaking of fluid and fetal movement were reviewed in detail with the patient. Please refer to After Visit Summary for other counseling recommendations.   No follow-ups on file.  Future Appointments  Date Time Provider Department Center  09/04/2022  6:30 AM [facility] SCHED ROOM [facility] None    Jerene Bears, MD

## 2022-08-30 NOTE — Telephone Encounter (Signed)
Preadmission screen  

## 2022-09-02 ENCOUNTER — Telehealth (HOSPITAL_COMMUNITY): Payer: Self-pay | Admitting: *Deleted

## 2022-09-02 NOTE — Telephone Encounter (Signed)
Preadmission screen  

## 2022-09-03 ENCOUNTER — Telehealth (HOSPITAL_COMMUNITY): Payer: Self-pay | Admitting: *Deleted

## 2022-09-03 ENCOUNTER — Encounter (HOSPITAL_COMMUNITY): Payer: Self-pay | Admitting: *Deleted

## 2022-09-03 NOTE — Telephone Encounter (Signed)
Preadmission screen  

## 2022-09-04 ENCOUNTER — Other Ambulatory Visit: Payer: Self-pay

## 2022-09-04 ENCOUNTER — Encounter (HOSPITAL_COMMUNITY): Payer: Self-pay | Admitting: Obstetrics & Gynecology

## 2022-09-04 ENCOUNTER — Inpatient Hospital Stay (HOSPITAL_COMMUNITY)
Admission: RE | Admit: 2022-09-04 | Discharge: 2022-09-07 | DRG: 805 | Disposition: A | Discharge status: Home / Self Care | Payer: 59 | Attending: Family Medicine | Admitting: Family Medicine

## 2022-09-04 ENCOUNTER — Inpatient Hospital Stay (HOSPITAL_COMMUNITY): Payer: 59

## 2022-09-04 DIAGNOSIS — J4530 Mild persistent asthma, uncomplicated: Secondary | ICD-10-CM | POA: Diagnosis present

## 2022-09-04 DIAGNOSIS — Z880 Allergy status to penicillin: Secondary | ICD-10-CM | POA: Diagnosis not present

## 2022-09-04 DIAGNOSIS — R000 Tachycardia, unspecified: Secondary | ICD-10-CM | POA: Diagnosis present

## 2022-09-04 DIAGNOSIS — O99013 Anemia complicating pregnancy, third trimester: Secondary | ICD-10-CM | POA: Diagnosis present

## 2022-09-04 DIAGNOSIS — O411290 Chorioamnionitis, unspecified trimester, not applicable or unspecified: Secondary | ICD-10-CM | POA: Diagnosis not present

## 2022-09-04 DIAGNOSIS — Z7982 Long term (current) use of aspirin: Secondary | ICD-10-CM | POA: Diagnosis not present

## 2022-09-04 DIAGNOSIS — Z7951 Long term (current) use of inhaled steroids: Secondary | ICD-10-CM

## 2022-09-04 DIAGNOSIS — O411230 Chorioamnionitis, third trimester, not applicable or unspecified: Secondary | ICD-10-CM | POA: Diagnosis present

## 2022-09-04 DIAGNOSIS — O4443 Low lying placenta NOS or without hemorrhage, third trimester: Secondary | ICD-10-CM | POA: Diagnosis not present

## 2022-09-04 DIAGNOSIS — O480 Post-term pregnancy: Principal | ICD-10-CM | POA: Diagnosis present

## 2022-09-04 DIAGNOSIS — D62 Acute posthemorrhagic anemia: Secondary | ICD-10-CM | POA: Diagnosis not present

## 2022-09-04 DIAGNOSIS — Z148 Genetic carrier of other disease: Secondary | ICD-10-CM

## 2022-09-04 DIAGNOSIS — Z3A41 41 weeks gestation of pregnancy: Secondary | ICD-10-CM

## 2022-09-04 DIAGNOSIS — E282 Polycystic ovarian syndrome: Secondary | ICD-10-CM | POA: Diagnosis present

## 2022-09-04 DIAGNOSIS — O99891 Other specified diseases and conditions complicating pregnancy: Secondary | ICD-10-CM | POA: Diagnosis not present

## 2022-09-04 DIAGNOSIS — O9081 Anemia of the puerperium: Secondary | ICD-10-CM | POA: Diagnosis not present

## 2022-09-04 DIAGNOSIS — Z3490 Encounter for supervision of normal pregnancy, unspecified, unspecified trimester: Secondary | ICD-10-CM

## 2022-09-04 DIAGNOSIS — Z3403 Encounter for supervision of normal first pregnancy, third trimester: Secondary | ICD-10-CM

## 2022-09-04 DIAGNOSIS — J45909 Unspecified asthma, uncomplicated: Secondary | ICD-10-CM | POA: Diagnosis not present

## 2022-09-04 DIAGNOSIS — D563 Thalassemia minor: Secondary | ICD-10-CM | POA: Diagnosis present

## 2022-09-04 DIAGNOSIS — O9952 Diseases of the respiratory system complicating childbirth: Secondary | ICD-10-CM | POA: Diagnosis not present

## 2022-09-04 LAB — CBC
HCT: 33.7 % — ABNORMAL LOW (ref 36.0–46.0)
Hemoglobin: 11.2 g/dL — ABNORMAL LOW (ref 12.0–15.0)
MCH: 26.5 pg (ref 26.0–34.0)
MCHC: 33.2 g/dL (ref 30.0–36.0)
MCV: 79.9 fL — ABNORMAL LOW (ref 80.0–100.0)
Platelets: 321 10*3/uL (ref 150–400)
RBC: 4.22 MIL/uL (ref 3.87–5.11)
RDW: 14.7 % (ref 11.5–15.5)
WBC: 5.1 10*3/uL (ref 4.0–10.5)
nRBC: 0 % (ref 0.0–0.2)

## 2022-09-04 LAB — TYPE AND SCREEN
ABO/RH(D): B POS
Antibody Screen: NEGATIVE

## 2022-09-04 LAB — RPR: RPR Ser Ql: NONREACTIVE

## 2022-09-04 MED ORDER — OXYTOCIN BOLUS FROM INFUSION
333.0000 mL | Freq: Once | INTRAVENOUS | Status: AC
  Administered 2022-09-05: 333 mL via INTRAVENOUS

## 2022-09-04 MED ORDER — FENTANYL-BUPIVACAINE-NACL 0.5-0.125-0.9 MG/250ML-% EP SOLN
12.0000 mL/h | EPIDURAL | Status: DC | PRN
  Administered 2022-09-05: 12 mL/h via EPIDURAL
  Filled 2022-09-04: qty 250

## 2022-09-04 MED ORDER — ACETAMINOPHEN 325 MG PO TABS: 650.0000 mg | ORAL_TABLET | ORAL | Status: DC | PRN

## 2022-09-04 MED ORDER — FENTANYL CITRATE (PF) 100 MCG/2ML IJ SOLN
50.0000 ug | INTRAMUSCULAR | Status: DC | PRN
  Administered 2022-09-04 (×2): 100 ug via INTRAVENOUS
  Filled 2022-09-04 (×2): qty 2

## 2022-09-04 MED ORDER — BUTORPHANOL TARTRATE 2 MG/ML IJ SOLN
2.0000 mg | Freq: Once | INTRAMUSCULAR | Status: AC
  Administered 2022-09-04: 2 mg via INTRAVENOUS
  Filled 2022-09-04: qty 1

## 2022-09-04 MED ORDER — LACTATED RINGERS IV SOLN: INTRAVENOUS | Status: DC

## 2022-09-04 MED ORDER — TERBUTALINE SULFATE 1 MG/ML IJ SOLN: 0.2500 mg | Freq: Once | INTRAMUSCULAR | Status: DC | PRN

## 2022-09-04 MED ORDER — PHENYLEPHRINE 80 MCG/ML (10ML) SYRINGE FOR IV PUSH (FOR BLOOD PRESSURE SUPPORT): 80.0000 ug | PREFILLED_SYRINGE | INTRAVENOUS | Status: DC | PRN

## 2022-09-04 MED ORDER — OXYTOCIN-SODIUM CHLORIDE 30-0.9 UT/500ML-% IV SOLN
2.5000 [IU]/h | INTRAVENOUS | Status: DC
  Administered 2022-09-05: 2.5 [IU]/h via INTRAVENOUS

## 2022-09-04 MED ORDER — OXYCODONE-ACETAMINOPHEN 5-325 MG PO TABS: 1.0000 | ORAL_TABLET | ORAL | Status: DC | PRN

## 2022-09-04 MED ORDER — OXYTOCIN-SODIUM CHLORIDE 30-0.9 UT/500ML-% IV SOLN
1.0000 m[IU]/min | INTRAVENOUS | Status: DC
  Administered 2022-09-04: 2 m[IU]/min via INTRAVENOUS
  Filled 2022-09-04: qty 500

## 2022-09-04 MED ORDER — LIDOCAINE HCL (PF) 1 % IJ SOLN: 30.0000 mL | INTRAMUSCULAR | Status: DC | PRN

## 2022-09-04 MED ORDER — DIPHENHYDRAMINE HCL 50 MG/ML IJ SOLN: 12.5000 mg | INTRAMUSCULAR | Status: DC | PRN

## 2022-09-04 MED ORDER — ONDANSETRON HCL 4 MG/2ML IJ SOLN
4.0000 mg | Freq: Four times a day (QID) | INTRAMUSCULAR | Status: DC | PRN
  Administered 2022-09-04 – 2022-09-05 (×2): 4 mg via INTRAVENOUS
  Filled 2022-09-04 (×2): qty 2

## 2022-09-04 MED ORDER — FLEET ENEMA 7-19 GM/118ML RE ENEM: 1.0000 | ENEMA | RECTAL | Status: DC | PRN

## 2022-09-04 MED ORDER — LACTATED RINGERS IV SOLN
500.0000 mL | Freq: Once | INTRAVENOUS | Status: AC
  Administered 2022-09-05: 500 mL via INTRAVENOUS

## 2022-09-04 MED ORDER — SODIUM CHLORIDE 0.9 % IV SOLN
12.5000 mg | Freq: Once | INTRAVENOUS | Status: AC
  Administered 2022-09-04: 12.5 mg via INTRAVENOUS
  Filled 2022-09-04: qty 12.5

## 2022-09-04 MED ORDER — LACTATED RINGERS IV SOLN
500.0000 mL | INTRAVENOUS | Status: DC | PRN
  Administered 2022-09-05: 1000 mL via INTRAVENOUS

## 2022-09-04 MED ORDER — OXYCODONE-ACETAMINOPHEN 5-325 MG PO TABS: 2.0000 | ORAL_TABLET | ORAL | Status: DC | PRN

## 2022-09-04 MED ORDER — EPHEDRINE 5 MG/ML INJ: 10.0000 mg | INTRAVENOUS | Status: DC | PRN

## 2022-09-04 MED ORDER — SOD CITRATE-CITRIC ACID 500-334 MG/5ML PO SOLN: 30.0000 mL | ORAL | Status: DC | PRN

## 2022-09-04 NOTE — H&P (Signed)
OBSTETRIC ADMISSION HISTORY AND PHYSICAL  Katherine Ross is a 22 y.o. female G1P0 with IUP at [redacted]w[redacted]d by LMP presenting for IOL for PD. She reports +FMs, No LOF, no VB, no blurry vision, headaches or peripheral edema, and RUQ pain.  She plans on breast and formula feeding. She request POPs for birth control. She received her prenatal care at  [facility]    Dating: By LMP --->  Estimated Date of Delivery: 08/27/22  Sono:    @[redacted]w[redacted]d , CWD, normal anatomy, cephalic presentation, posterior placenta, 2396 g, 27% EFW   Prenatal History/Complications: low lying placenta resolved   Past Medical History: Past Medical History:  Diagnosis Date   Asthma    Eczema     Past Surgical History: Past Surgical History:  Procedure Laterality Date   NO PAST SURGERIES      Obstetrical History: OB History     Gravida  1   Para      Term      Preterm      AB      Living         SAB      IAB      Ectopic      Multiple      Live Births              Social History Social History   Socioeconomic History   Marital status: Single    Spouse name: Not on file   Number of children: Not on file   Years of education: Not on file   Highest education level: Not on file  Occupational History   Not on file  Tobacco Use   Smoking status: Never    Passive exposure: Never   Smokeless tobacco: Never  Vaping Use   Vaping Use: Never used  Substance and Sexual Activity   Alcohol use: Not Currently    Comment: social   Drug use: Not Currently    Types: Marijuana    Comment: last use weed years ago   Sexual activity: Yes    Birth control/protection: None  Other Topics Concern   Not on file  Social History Narrative   Not on file   Social Determinants of Health   Financial Resource Strain: Not on file  Food Insecurity: No Food Insecurity (09/04/2022)   Hunger Vital Sign    Worried About Running Out of Food in the Last Year: Never true    Ran Out of Food in the Last Year: Never true   Transportation Needs: No Transportation Needs (09/04/2022)   PRAPARE - Administrator, Civil Service (Medical): No    Lack of Transportation (Non-Medical): No  Physical Activity: Not on file  Stress: Not on file  Social Connections: Not on file    Family History: Family History  Problem Relation Age of Onset   Diabetes Mother    Hypertension Mother    Hyperlipidemia Mother    Food Allergy Brother        tree nuts   Autism Brother    Schizophrenia Brother    Hyperlipidemia Maternal Grandmother    Hypertension Maternal Grandmother    Diabetes Maternal Grandmother    Allergic rhinitis Maternal Grandmother    Hypothyroidism Maternal Grandmother    Heart attack Maternal Grandfather    Angioedema Neg Hx    Asthma Neg Hx    Atopy Neg Hx    Eczema Neg Hx    Immunodeficiency Neg Hx    Urticaria Neg Hx  Allergies: Allergies  Allergen Reactions   Apple Itching   Penicillins     All kinds    Pollen Extract     Medications Prior to Admission  Medication Sig Dispense Refill Last Dose   albuterol (PROAIR HFA) 108 (90 Base) MCG/ACT inhaler Inhale 2 puffs into the lungs every 4 (four) hours as needed for wheezing or shortness of breath. 6.7 g 1 Past Week   aspirin 81 MG chewable tablet Chew 1 tablet (81 mg total) by mouth daily.   Past Week   ferrous sulfate 324 MG TBEC Take 324 mg by mouth.   Past Week   Prenatal Vit-Fe Fumarate-FA (MULTIVITAMIN-PRENATAL) 27-0.8 MG TABS tablet Take 1 tablet by mouth daily at 12 noon.   Past Week   fluticasone (FLOVENT HFA) 44 MCG/ACT inhaler Inhale 2 puffs into the lungs 2 (two) times daily. 10.6 g 0 More than a month     Review of Systems   All systems reviewed and negative except as stated in HPI  Blood pressure 131/80, pulse (!) 117, temperature 98.3 F (36.8 C), temperature source Oral, resp. rate 16, height 5\' 5"  (1.651 m), weight 95.3 kg, last menstrual period 11/20/2021. General appearance: alert, cooperative, and  appears stated age Lungs: normal work of breathing  Heart: regular rate and rhythm Abdomen: gravid   Extremities: Homans sign is negative, no sign of DVT Presentation: cephalic Fetal monitoringBaseline: 130 bpm, Variability: Good {> 6 bpm), Accelerations: Reactive, and Decelerations: Absent Uterine activity irregular      Prenatal labs: ABO, Rh: B/Positive/-- (10/24 1640) Antibody: Negative (10/24 1640) Rubella: 3.18 (10/24 1640) RPR: Non Reactive (02/16 0850)  HBsAg: Negative (10/24 1640)  HIV: Non Reactive (02/16 0850)  GBS: Negative/-- (04/19 1112)  1 hr Glucola Normal  Genetic screening  LR female  Anatomy US Normal  Prenatal Transfer Tool  Maternal Diabetes: No Genetic Screening: Normal Maternal Ultrasounds/Referrals: Normal Fetal Ultrasounds or other Referrals:  Referred to Materal Fetal Medicine  Maternal Substance Abuse:  No Significant Maternal Medications:  None Significant Maternal Lab Results:  Group B Strep negative Number of Prenatal Visits:greater than 3 verified prenatal visits Other Comments:  None  No results found for this or any previous visit (from the past 24 hour(s)).  Patient Active Problem List   Diagnosis Date Noted   Post-dates pregnancy 09/04/2022   BMI 32.0-32.9,adult 07/12/2022   Anemia complicating pregnancy, third trimester 07/12/2022   Penicillin allergy 06/03/2022   Low lying placenta nos or without hemorrhage, third trimester 04/10/2022   Alpha thalassemia silent carrier 02/21/2022   Supervision of normal first pregnancy in third trimester 02/11/2022   PCOS (polycystic ovarian syndrome) 10/30/2015   Eczema 06/21/2015   Asthma, mild persistent 06/21/2015    Assessment/Plan:  Katherine Ross is a 22 y.o. G1P0 at [redacted]w[redacted]d here forIOL for PD   #Labor:Interested in water birth #Pain: Per patient request  #FWB: Cat 1 #ID:  GBS neg  #MOF: Breast and formula  #MOC: POPs #Circ:  Yes   Katherine Savage, MD  09/04/2022, 7:11 AM   Katherine  Ross is a 22 y.o. G1P0 at [redacted]w[redacted]d for IOL for postdates. I was asked to see her as she desires waterbirth. Has attended class and sent certificate.  Good fm, some leaking of fluid since yesterday. None currently. No VB. Cat 1 FHR UCs irregular, not perceived by pt VSS SVE: 3/70/-2, posterior, vtx No CNM in house until 1000 to cover for waterbirth Discussed starting pitocin to try to get good uc  pattern, AROM if needed Then would try cutting pitocin off to see if able to get in water and remain in good labor pattern. Reviewed potential reasons that could develop during labor that would require not being able to have waterbirth. Reviewed consent form and signed.  Cheral Marker, CNM,  [facility] 09/04/2022 8:03 AM

## 2022-09-04 NOTE — Progress Notes (Signed)
Patient ID: Katherine Ross, female   DOB: [birth year], 22 y.o.   MRN: [id number]  Pitocin was stopped at 2030; now sitting up in bed breathing w ctx- states they are coming as frequently as they were when it was running @ 55mu/min; is leaking fluid & mucous; really wants to sleep  BPs 128/74, 142/88, 131/80 FHR intermittently 135-145 Ctx q 2-3 mins per pt Cx deferred  IUP@41 .1wks Latent phase of IOL process  Will give Stadol/Phenergan combo to see if she can get some rest  Arabella Merles [facility] 09/04/2022 10:39 PM

## 2022-09-04 NOTE — Progress Notes (Signed)
Labor Progress Note Katherine Ross is a 22 y.o. G1P0 at [redacted]w[redacted]d presented for IOL.   S: No acute concerns.   O:  BP 131/80   Pulse (!) 117   Temp 98.3 F (36.8 C) (Oral)   Resp 16   Ht 5\' 5"  (1.651 m)   Wt 95.3 kg   LMP 11/20/2021   BMI 34.95 kg/m  EFM: 135bpm/moderate/+accels, no decels  CVE: Dilation: 3.5 Effacement (%): 70 Station: -3 Presentation: Vertex Exam by:: Selena Batten, CNM   A&P: 22 y.o. G1P0 [redacted]w[redacted]d here for PDIOL.  #Labor: Contractions 7 mins apart. Planning for waterbirth. Currently on pitocin. Plan is to get regular interval contractions and titrate of pitocin prior to getting in tub.  #Pain: Maternally supported #FWB: Cat I  #GBS negative  Asthma -Avoid hemabate  Katherine Iezzi Autry-Lott, DO 10:10 AM

## 2022-09-04 NOTE — Progress Notes (Signed)
Katherine Ross is a 22 y.o. G1P0 at [redacted]w[redacted]d by LMP admitted for induction of labor due to postdates.  Subjective: Pt resting in bed, able to sleep through contractions. S/O in room for support.  Objective: BP 131/80   Pulse (!) 117   Temp 98.3 F (36.8 C) (Oral)   Resp 16   Ht 5\' 5"  (1.651 m)   Wt 95.3 kg   LMP 11/20/2021   BMI 34.95 kg/m  No intake/output data recorded. No intake/output data recorded.  FHT:  FHR: 140 bpm, variability: moderate,  accelerations:  Present,  decelerations:  Absent UC:   irregular, every 3-7 minutes SVE:   Dilation: 3.5 Effacement (%): 70 Station: -3 Exam by:: Selena Batten, CNM  Labs: Lab Results  Component Value Date   WBC 5.1 09/04/2022   HGB 11.2 (L) 09/04/2022   HCT 33.7 (L) 09/04/2022   MCV 79.9 (L) 09/04/2022   PLT 321 09/04/2022    Assessment / Plan: Induction of labor due to postdates,  progressing well on pitocin  Labor:  Pt doing well, sleeping off and on without pain medication.  Will titrate Pitocin for adequacy of contractions, AROM when possible.  Will try to titrate Pitocin down/turn off if adequate labor achieved so pt can use waterbirth tub if desired.   Preeclampsia:   n/a Fetal Wellbeing:  Category I Pain Control:  Labor support without medications I/D:   GBS neg Anticipated MOD:  NSVD  Sharen Counter, CNM 09/04/2022, 11:07 AM

## 2022-09-05 ENCOUNTER — Inpatient Hospital Stay (HOSPITAL_COMMUNITY): Payer: 59 | Admitting: Anesthesiology

## 2022-09-05 ENCOUNTER — Inpatient Hospital Stay (HOSPITAL_COMMUNITY): Admission: RE | Admit: 2022-09-05 | Payer: 59 | Source: Ambulatory Visit

## 2022-09-05 ENCOUNTER — Encounter (HOSPITAL_COMMUNITY): Payer: Self-pay | Admitting: Obstetrics & Gynecology

## 2022-09-05 DIAGNOSIS — O4443 Low lying placenta NOS or without hemorrhage, third trimester: Secondary | ICD-10-CM

## 2022-09-05 DIAGNOSIS — Z3A41 41 weeks gestation of pregnancy: Secondary | ICD-10-CM

## 2022-09-05 DIAGNOSIS — O411230 Chorioamnionitis, third trimester, not applicable or unspecified: Secondary | ICD-10-CM

## 2022-09-05 DIAGNOSIS — O411290 Chorioamnionitis, unspecified trimester, not applicable or unspecified: Secondary | ICD-10-CM | POA: Diagnosis not present

## 2022-09-05 LAB — COMPREHENSIVE METABOLIC PANEL
ALT: 14 U/L (ref 0–44)
AST: 20 U/L (ref 15–41)
Albumin: 2.5 g/dL — ABNORMAL LOW (ref 3.5–5.0)
Alkaline Phosphatase: 151 U/L — ABNORMAL HIGH (ref 38–126)
Anion gap: 10 (ref 5–15)
BUN: <5 mg/dL — ABNORMAL LOW (ref 6–20)
CO2: 18 mmol/L — ABNORMAL LOW (ref 22–32)
Calcium: 9 mg/dL (ref 8.9–10.3)
Chloride: 104 mmol/L (ref 98–111)
Creatinine, Ser: 0.7 mg/dL (ref 0.44–1.00)
GFR, Estimated: >60 mL/min (ref >60)
Glucose, Bld: 99 mg/dL (ref 70–99)
Potassium: 3.3 mmol/L — ABNORMAL LOW (ref 3.5–5.1)
Sodium: 132 mmol/L — ABNORMAL LOW (ref 135–145)
Total Bilirubin: 0.3 mg/dL (ref 0.3–1.2)
Total Protein: 6 g/dL — ABNORMAL LOW (ref 6.5–8.1)

## 2022-09-05 MED ORDER — WITCH HAZEL-GLYCERIN EX PADS: 1.0000 | MEDICATED_PAD | CUTANEOUS | Status: DC | PRN

## 2022-09-05 MED ORDER — METHYLERGONOVINE MALEATE 0.2 MG/ML IJ SOLN: 0.2000 mg | INTRAMUSCULAR | Status: DC | PRN

## 2022-09-05 MED ORDER — COCONUT OIL OIL
1.0000 | TOPICAL_OIL | Status: DC | PRN
  Administered 2022-09-05 – 2022-09-07 (×2): 1 via TOPICAL

## 2022-09-05 MED ORDER — ACETAMINOPHEN 325 MG PO TABS: 650.0000 mg | ORAL_TABLET | ORAL | Status: DC | PRN

## 2022-09-05 MED ORDER — GENTAMICIN SULFATE 40 MG/ML IJ SOLN
5.0000 mg/kg | INTRAVENOUS | Status: DC
  Administered 2022-09-05: 360 mg via INTRAVENOUS
  Filled 2022-09-05: qty 9

## 2022-09-05 MED ORDER — DOCUSATE SODIUM 100 MG PO CAPS
100.0000 mg | ORAL_CAPSULE | Freq: Two times a day (BID) | ORAL | Status: DC
  Administered 2022-09-05 – 2022-09-07 (×4): 100 mg via ORAL
  Filled 2022-09-05 (×5): qty 1

## 2022-09-05 MED ORDER — OXYCODONE HCL 5 MG PO TABS: 10.0000 mg | ORAL_TABLET | ORAL | Status: DC | PRN

## 2022-09-05 MED ORDER — METHYLERGONOVINE MALEATE 0.2 MG PO TABS: 0.2000 mg | ORAL_TABLET | ORAL | Status: DC | PRN

## 2022-09-05 MED ORDER — PRENATAL MULTIVITAMIN CH
1.0000 | ORAL_TABLET | Freq: Every day | ORAL | Status: DC
  Administered 2022-09-06 – 2022-09-07 (×2): 1 via ORAL
  Filled 2022-09-05 (×2): qty 1

## 2022-09-05 MED ORDER — DIPHENHYDRAMINE HCL 25 MG PO CAPS: 25.0000 mg | ORAL_CAPSULE | Freq: Four times a day (QID) | ORAL | Status: DC | PRN

## 2022-09-05 MED ORDER — BENZOCAINE-MENTHOL 20-0.5 % EX AERO
1.0000 | INHALATION_SPRAY | CUTANEOUS | Status: DC | PRN
  Administered 2022-09-06 – 2022-09-07 (×2): 1 via TOPICAL
  Filled 2022-09-05 (×3): qty 56

## 2022-09-05 MED ORDER — METHYLERGONOVINE MALEATE 0.2 MG/ML IJ SOLN
INTRAMUSCULAR | Status: AC
  Administered 2022-09-05: 0.2 mg
  Filled 2022-09-05: qty 1

## 2022-09-05 MED ORDER — ONDANSETRON HCL 4 MG PO TABS: 4.0000 mg | ORAL_TABLET | ORAL | Status: DC | PRN

## 2022-09-05 MED ORDER — DIBUCAINE (PERIANAL) 1 % EX OINT: 1.0000 | TOPICAL_OINTMENT | CUTANEOUS | Status: DC | PRN

## 2022-09-05 MED ORDER — ACETAMINOPHEN 500 MG PO TABS
1000.0000 mg | ORAL_TABLET | Freq: Four times a day (QID) | ORAL | Status: DC | PRN
  Administered 2022-09-05: 1000 mg via ORAL
  Filled 2022-09-05: qty 2

## 2022-09-05 MED ORDER — SIMETHICONE 80 MG PO CHEW: 80.0000 mg | CHEWABLE_TABLET | ORAL | Status: DC | PRN

## 2022-09-05 MED ORDER — CLINDAMYCIN PHOSPHATE 900 MG/50ML IV SOLN
900.0000 mg | Freq: Three times a day (TID) | INTRAVENOUS | Status: DC
  Administered 2022-09-05: 900 mg via INTRAVENOUS
  Filled 2022-09-05 (×2): qty 50

## 2022-09-05 MED ORDER — LIDOCAINE HCL (PF) 1 % IJ SOLN
INTRAMUSCULAR | Status: DC | PRN
  Administered 2022-09-05: 11 mL via EPIDURAL

## 2022-09-05 MED ORDER — IBUPROFEN 600 MG PO TABS
600.0000 mg | ORAL_TABLET | Freq: Four times a day (QID) | ORAL | Status: DC
  Administered 2022-09-05 – 2022-09-07 (×9): 600 mg via ORAL
  Filled 2022-09-05 (×9): qty 1

## 2022-09-05 MED ORDER — ONDANSETRON HCL 4 MG/2ML IJ SOLN: 4.0000 mg | INTRAMUSCULAR | Status: DC | PRN

## 2022-09-05 MED ORDER — OXYCODONE HCL 5 MG PO TABS: 5.0000 mg | ORAL_TABLET | ORAL | Status: DC | PRN

## 2022-09-05 NOTE — Progress Notes (Signed)
Labor Progress Note Lorayne Blomberg is a 22 y.o. G1P0 at [redacted]w[redacted]d presented for PD-IOL  S: Patient was sleeping, got some good rest.   ROM was performed 5/22 at 15:52 Pitocin restarted at 0400  O:  BP (!) 98/53   Pulse (!) 126   Temp (!) 100.4 F (38 C) (Oral)   Resp 18   Ht 5\' 5"  (1.651 m)   Wt 95.3 kg   LMP 11/20/2021   SpO2 100%   BMI 34.95 kg/m  EFM: 120/moderate/+accels, no decels Toco: q3-5 min  CVE: Dilation: 10 Dilation Complete Date: 09/05/22 Dilation Complete Time: 1232 Effacement (%): 100 Cervical Position: Posterior Station: Plus 2 Presentation: Vertex Exam by:: Alvester Morin, MD   A&P: 22 y.o. G1P0 [redacted]w[redacted]d here for PDIOL #Labor: Complete and ready to push.  #Pain: Epidural.  #FWB: Cat 1-- somewhat difficult to determine fetal and maternal HR due to maternal tachycardia. #GBS negative  #Triple I: Maternal tachycardia, +fever (axillary 99.8). s/p acetaminophen, clinda and gent. Improving tachycardia and no fever.   Federico Flake, MD 2:24 PM

## 2022-09-05 NOTE — Discharge Summary (Signed)
Postpartum Discharge Summary      Patient Name: Katherine Ross DOB: October 02, 2000 MRN: [id number]  Date of admission: 09/04/2022 Delivery date:09/05/2022  Delivering provider: Federico Flake  Date of discharge: 09/07/2022  Admitting diagnosis: Post-dates pregnancy [O48.0] Intrauterine pregnancy: [redacted]w[redacted]d     Secondary diagnosis:  Principal Problem:   Vaginal delivery Active Problems:   Asthma, mild persistent   PCOS (polycystic ovarian syndrome)   Supervision of normal first pregnancy in third trimester   Alpha thalassemia silent carrier   Penicillin allergy   Anemia complicating pregnancy, third trimester   Post-dates pregnancy   Obstetrical laceration, first degree   Postpartum hemorrhage   Chorioamnionitis  Additional problems: none    Discharge diagnosis: Term Pregnancy Delivered                                              Post partum procedures:IV iron Augmentation: AROM and Pitocin Complications: Hemorrhage>1057mL  Hospital course: Induction of Labor With Vaginal Delivery   22 y.o. yo G1P0 at [redacted]w[redacted]d was admitted to the hospital 09/04/2022 for induction of labor.  Indication for induction: Postdates.  Patient had an labor course complicated by [facility] Membrane Rupture Time/Date: 3:52 PM ,09/04/2022   Delivery Method:Vaginal, Spontaneous  Episiotomy: None  Lacerations:  1st degree;Perineal;Labial  Details of delivery can be found in separate delivery note.  Patient had a postpartum course complicated bynothing. Patient is discharged home 09/07/22.  Newborn Data: Birth date:09/05/2022  Birth time:1:47 PM  Gender:Female  Living status:Living  Apgars:6 ,9  Weight:3690 g   Magnesium Sulfate received: No BMZ received: No Rhophylac:No MMR:N/A T-DaP:Given prenatally Flu: N/A Transfusion:No  Physical exam  Vitals:   09/05/22 2345 09/06/22 0506 09/06/22 2122 09/07/22 0645  BP: 129/83 119/80 123/79 (!) 107/47  Pulse: (!) 118 (!) 123 (!) 112 (!) 107  Resp: 18 17 18 17    Temp: 98.2 F (36.8 C) 98.3 F (36.8 C) 98.2 F (36.8 C) 98.7 F (37.1 C)  TempSrc: Oral Oral Oral Oral  SpO2: 100% 100% 100% 100%  Weight:      Height:       General: alert, cooperative, and no distress Lochia: appropriate Uterine Fundus: firm Incision: N/A DVT Evaluation: No evidence of DVT seen on physical exam. Negative Homan's sign. No cords or calf tenderness. Labs: Lab Results  Component Value Date   WBC 12.4 (H) 09/06/2022   HGB 7.9 (L) 09/06/2022   HCT 23.5 (L) 09/06/2022   MCV 80.2 09/06/2022   PLT 248 09/06/2022      Latest Ref Rng & Units 09/05/2022    9:34 AM  CMP  Glucose 70 - 99 mg/dL 99   BUN 6 - 20 mg/dL <5   Creatinine 4.09 - 1.00 mg/dL 8.11   Sodium 914 - 782 mmol/L 132   Potassium 3.5 - 5.1 mmol/L 3.3   Chloride 98 - 111 mmol/L 104   CO2 22 - 32 mmol/L 18   Calcium 8.9 - 10.3 mg/dL 9.0   Total Protein 6.5 - 8.1 g/dL 6.0   Total Bilirubin 0.3 - 1.2 mg/dL 0.3   Alkaline Phos 38 - 126 U/L 151   AST 15 - 41 U/L 20   ALT 0 - 44 U/L 14    Edinburgh Score:    09/05/2022    8:20 PM  Edinburgh Postnatal Depression Scale Screening Tool  I have  been able to laugh and see the funny side of things. 0  I have looked forward with enjoyment to things. 0  I have blamed myself unnecessarily when things went wrong. 0  I have been anxious or worried for no good reason. 0  I have felt scared or panicky for no good reason. 0  Things have been getting on top of me. 0  I have been so unhappy that I have had difficulty sleeping. 0  I have felt sad or miserable. 0  I have been so unhappy that I have been crying. 0  The thought of harming myself has occurred to me. 0  Edinburgh Postnatal Depression Scale Total 0      After visit meds:  Allergies as of 09/07/2022       Reactions   Apple Itching   Penicillins Other (See Comments)   All kinds        Medication List     STOP taking these medications    aspirin 81 MG chewable tablet       TAKE  these medications    albuterol 108 (90 Base) MCG/ACT inhaler Commonly known as: ProAir HFA Inhale 2 puffs into the lungs every 4 (four) hours as needed for wheezing or shortness of breath.   ferrous sulfate 324 MG Tbec Take 1 tablet (324 mg total) by mouth every other day. What changed: when to take this   fluticasone 44 MCG/ACT inhaler Commonly known as: FLOVENT HFA Inhale 2 puffs into the lungs 2 (two) times daily.   ibuprofen 600 MG tablet Commonly known as: ADVIL Take 1 tablet (600 mg total) by mouth every 6 (six) hours.   multivitamin-prenatal 27-0.8 MG Tabs tablet Take 1 tablet by mouth daily.   norethindrone 0.35 MG tablet Commonly known as: Ortho Micronor Take 1 tablet (0.35 mg total) by mouth daily.         Discharge home in stable condition Infant Feeding: Breast Infant Disposition:NICU Discharge instruction: per After Visit Summary and Postpartum booklet. Activity: Advance as tolerated. Pelvic rest for 6 weeks.  Diet: routine diet Anticipated Birth Control: POPs Postpartum Appointment:4 weeks Additional Postpartum F/U: none Future Appointments: Future Appointments  Date Time Provider Department Center  10/16/2022  3:15 PM Jerene Bears, MD [facility]   Follow up Visit:      09/07/2022 Jacklyn Shell, CNM

## 2022-09-05 NOTE — Progress Notes (Signed)
Patient ID: Katherine Ross, female   DOB: 02/13/2001, 22 y.o.   MRN: [id number]  Pt got some rest from Stadol/Phenergan but not sleep; is requesting to get into the water  BP 129/75, had T 100.4 but 98.5 about 1.5h later with fetal tachycardia FHR 135-145, +accels, no decels, avg LTV Ctx q 2-4 mins Cx 3+/80/vtx -2 post  IUP@41 .2wks IOL process  Recommend starting Pitocin as hasn't had any change other than effacement since this morning; she is agreeable  Arabella Merles CNM 09/05/2022 2:08 AM

## 2022-09-05 NOTE — Progress Notes (Signed)
Patient ID: Etoya Wollan, female   DOB: [birth year], 22 y.o.   MRN: [id number]  Comfortable w epidural; Pit started at 0425; chart review shows some BP elevations 140s/60-70s intermittent between [phone] with all normal BPs before and after  BPs 136/78, 120/50, 113/66, T 99.2 FHR 120-130s, +accels, occ mi variables Ctx 2-5 mins with Pit at 30mu/min Cx deferred  IUP@41 .2wks IOL process ROM x 15.5hrs  Discussion of not performing a cervical exam currently since the Pitocin hasn't been running very long and ctx aren't regular yet; will continue to monitor temp and discussed may need antibiotics if temp becomes >100.4  Arabella Merles [facility] 09/05/2022

## 2022-09-05 NOTE — Progress Notes (Signed)
Labor Progress Note Katherine Ross is a 22 y.o. G1P0 at [redacted]w[redacted]d presented for PD-IOL  S: Increased vaginal pressure per patient but comfortable with epidural. Mother Dewayne Hatch, FOB Ashanni, and brother at bedside. Dewayne Hatch was giving Kimi a wonderful pep talk when we entered.   ROM was performed 5/22 at 15:52 Pitocine restarted at 0400  O:  BP (!) 101/35   Pulse (!) 116   Temp 99.8 F (37.7 C) (Axillary)   Resp 18   Ht 5\' 5"  (1.651 m)   Wt 95.3 kg   LMP 11/20/2021   SpO2 100%   BMI 34.95 kg/m  EFM: 120/moderate/+accels, no decels Toco: q7 min  CVE: Dilation: Lip/rim Effacement (%): 80 Cervical Position: Posterior Station: Plus 1 Presentation: Vertex Exam by:: Alvester Morin, MD   A&P: 22 y.o. G1P0 [redacted]w[redacted]d here for PDIOL #Labor: Wonderful progression of labor with pitocin therapy, currently on Pitocin 10mu. No need to IUPC at this point with appropriate cervical change. Placed in exaggerated sims to maternal right with peanut. Will reevaluate in 1 hr unless increased pressure before.  #Pain: Epidural. Patient asked to hit her button for epidural and this was done. Encouraged maternal rest.  #FWB: Cat 1-- somewhat difficult to determine fetal and maternal HR due to maternal tachycardia. #GBS negative  #Triple I: Maternal tachycardia, +fever (axillary 99.8). s/p acetaminophen, clinda and gent. Improving tachycardia and no fever.   Federico Flake, MD 10:33 AM

## 2022-09-05 NOTE — Anesthesia Procedure Notes (Signed)
Epidural Patient location during procedure: OB Start time: 09/05/2022 2:47 AM End time: 09/05/2022 3:08 AM  Staffing Anesthesiologist: Lowella Curb, MD Performed: anesthesiologist   Preanesthetic Checklist Completed: patient identified, IV checked, site marked, risks and benefits discussed, surgical consent, monitors and equipment checked, pre-op evaluation and timeout performed  Epidural Patient position: sitting Prep: ChloraPrep Patient monitoring: heart rate, cardiac monitor, continuous pulse ox and blood pressure Approach: midline Location: L2-L3 Injection technique: LOR saline  Needle:  Needle type: Tuohy  Needle gauge: 17 G Needle length: 9 cm Needle insertion depth: 5 cm Catheter type: closed end flexible Catheter size: 20 Guage Catheter at skin depth: 9 cm Test dose: negative  Assessment Events: blood not aspirated, injection not painful, no injection resistance, no paresthesia and negative IV test  Additional Notes Reason for block:procedure for pain

## 2022-09-05 NOTE — Anesthesia Preprocedure Evaluation (Signed)
Anesthesia Evaluation  Patient identified by MRN, date of birth, ID band Patient awake    Reviewed: Allergy & Precautions, H&P , NPO status , Patient's Chart, lab work & pertinent test results  Airway Mallampati: II  TM Distance: >3 FB Neck ROM: Full    Dental no notable dental hx.    Pulmonary asthma    Pulmonary exam normal breath sounds clear to auscultation       Cardiovascular negative cardio ROS Normal cardiovascular exam Rhythm:Regular Rate:Normal     Neuro/Psych negative neurological ROS  negative psych ROS   GI/Hepatic negative GI ROS, Neg liver ROS,,,  Endo/Other  negative endocrine ROS    Renal/GU negative Renal ROS  negative genitourinary   Musculoskeletal negative musculoskeletal ROS (+)    Abdominal   Peds negative pediatric ROS (+)  Hematology negative hematology ROS (+)   Anesthesia Other Findings   Reproductive/Obstetrics (+) Pregnancy                             Anesthesia Physical Anesthesia Plan  ASA: 2  Anesthesia Plan: Epidural   Post-op Pain Management:    Induction:   PONV Risk Score and Plan:   Airway Management Planned:   Additional Equipment:   Intra-op Plan:   Post-operative Plan:   Informed Consent:   Plan Discussed with:   Anesthesia Plan Comments:        Anesthesia Quick Evaluation  

## 2022-09-05 NOTE — Consult Note (Signed)
ANTIBIOTIC CONSULT NOTE - INITIAL  Pharmacy Consult for Gentamicin Indication: Chorioamnionitis   Allergies  Allergen Reactions   Apple Itching   Penicillins Other (See Comments)    All kinds     Patient Measurements: Height: 5\' 5"  (165.1 cm) Weight: 95.3 kg (210 lb) IBW/kg (Calculated) : 57 Adjusted Body Weight: 72.3 kg   Vital Signs: Temp: 99.8 F (37.7 C) (05/23 0839) Temp Source: Axillary (05/23 0839) BP: 126/67 (05/23 0900) Pulse Rate: 127 (05/23 0900)  Labs: Recent Labs    09/04/22 0623  WBC 5.1  HGB 11.2*  PLT 321   No results for input(s): "GENTTROUGH", "GENTPEAK", "GENTRANDOM" in the last 72 hours.   Microbiology: No results found for this or any previous visit (from the past 720 hour(s)).  Medications:  Clindamycin 900 mg every 8 hours (5/23>>   Assessment: 22 y.o. female G1P0 at [redacted]w[redacted]d presenting for IOL for PD complicated by chorio. Due to her penicillin allergy, clindamycin and gentamicin were initiated this AM.    Goal of Therapy:  Gentamicin peak 6-8 mg/L and Trough < 1 mg/L  Plan:  Gentamicin 360 mg IV every 24 hrs  Check Scr with next labs if gentamicin continued. Will check gentamicin levels if continued > 72hr or clinically indicated.  Janey Greaser 09/05/2022,9:18 AM

## 2022-09-05 NOTE — Progress Notes (Signed)
Labor Progress Note Katherine Ross is a 22 y.o. G1P0 at [redacted]w[redacted]d presented for PD-IOL  S: Shivering and reporting butt pain. Patient had initially be resistant to interventions because of desiring WB. She received in epidural today and was started back on pitocin at about 0400  ROM was performed 5/22 at 15:52  O:  BP 126/67   Pulse (!) 127   Temp 99.8 F (37.7 C) (Axillary)   Resp 18   Ht 5\' 5"  (1.651 m)   Wt 95.3 kg   LMP 11/20/2021   SpO2 100%   BMI 34.95 kg/m  EFM: 120/moderate/+accels, no decels Toco: q7 min  CVE: Dilation: 3.5 Effacement (%): 80 Cervical Position: Posterior Station: -2 Presentation: Vertex Exam by:: Pincus Badder CNM   A&P: 22 y.o. G1P0 [redacted]w[redacted]d here for PDIOL #Labor: Progressing slowly, but was resistant to interventions at initial presentation.  She was accepted them prior to transitioning to my care. Discussed with family and patient plan for up titration of pitocin with the goal of q3 minute ctx for at least 2 hrs before checking cervix and treatment of infection.   #Pain: Epidural.  #FWB: Cat 1-- somewhat difficult to determine fetal and maternal HR due to maternal tachycardia. #GBS negative  #Triple I: Maternal tachycardia, +fever (axillary 99.8). Discussed with famiy starting abx treatment (Clinda/Gent). S/p acetaminophen.   Federico Flake, MD 9:03 AM

## 2022-09-06 LAB — CBC
HCT: 23.5 % — ABNORMAL LOW (ref 36.0–46.0)
Hemoglobin: 7.9 g/dL — ABNORMAL LOW (ref 12.0–15.0)
MCH: 27 pg (ref 26.0–34.0)
MCHC: 33.6 g/dL (ref 30.0–36.0)
MCV: 80.2 fL (ref 80.0–100.0)
Platelets: 248 10*3/uL (ref 150–400)
RBC: 2.93 MIL/uL — ABNORMAL LOW (ref 3.87–5.11)
RDW: 14.8 % (ref 11.5–15.5)
WBC: 12.4 10*3/uL — ABNORMAL HIGH (ref 4.0–10.5)
nRBC: 0 % (ref 0.0–0.2)

## 2022-09-06 MED ORDER — KETOROLAC TROMETHAMINE 30 MG/ML IJ SOLN
15.0000 mg | Freq: Once | INTRAMUSCULAR | Status: AC
  Administered 2022-09-06: 15 mg via INTRAVENOUS
  Filled 2022-09-06: qty 1

## 2022-09-06 MED ORDER — ACETAMINOPHEN 325 MG PO TABS
650.0000 mg | ORAL_TABLET | Freq: Four times a day (QID) | ORAL | Status: DC
  Administered 2022-09-06 – 2022-09-07 (×6): 650 mg via ORAL
  Filled 2022-09-06 (×6): qty 2

## 2022-09-06 MED ORDER — SODIUM CHLORIDE 0.9 % IV SOLN
500.0000 mg | Freq: Once | INTRAVENOUS | Status: AC
  Administered 2022-09-06: 500 mg via INTRAVENOUS
  Filled 2022-09-06: qty 25

## 2022-09-06 NOTE — Lactation Note (Signed)
This note was copied from a baby's chart.  NICU Lactation Consultation Note  Patient Name: Boy Genysis Ballas ZOXWR'U Date: 09/06/2022 Age:24 hours  Reason for consult: Initial assessment; Primapara; 1st time breastfeeding; Term; NICU baby; Other (Comment) ([facility] insurance)  SUBJECTIVE Visited with family of 35 hours old FT NICU female; Ms. Blando is a P1 but hasn't started pumping yet. She was set up with a DEBP in her room in couplet care but hasn't had a chance to use it, she's been very tired and sleepy. Explained that the purpose of pumping this early on is mainly for breast stimulation and not to get volume. This LC assisted with the initiation of pumping at 28 hours post-partum; she was able to get small amounts of colostrum, praised her for her efforts. Her plan is to do direct breastfeeding and along with pumping and bottle feeding. She has [employer], her mom [facility]) is the insurance carrier. Issued her employee pump; she requested the hands-free model. Reviewed pumping schedule, pump settings, lactogenesis II and anticipatory guidelines.   OBJECTIVE Infant data: Mother's Current Feeding Choice: Breast Milk and Formula  Infant feeding assessment Scale for Readiness: 3   Maternal data: G1P1001  Vaginal, Spontaneous Has patient been taught Hand Expression?: Yes Hand Expression Comments: no colostrum noted yet Significant Breast History:: (+++) breast changes during the pregnancy Current breast feeding challenges:: NICU admission Does the patient have breastfeeding experience prior to this delivery?: No Pumping frequency: initiated pumping at 28 hours post-partum Pumped volume: 5 mL Flange Size: 24 Risk factor for low milk supply:: primipara, prematurity, PPH of 1580 cc, infant separation, late onset of pumping  Pump: Employee Pump (Medela Freestyle hands free)  ASSESSMENT Infant: LATCH Documentation Latch: 1 Audible Swallowing: 0 Type of Nipple: 2 Comfort  (Breast/Nipple): 2 Hold (Positioning): 1 LATCH Score: 6  Feeding Status: Scheduled 8-11-2-5  Maternal: Milk volume: Normal  INTERVENTIONS/PLAN Interventions: Interventions: Breast feeding basics reviewed; Breast massage; Hand express; DEBP; Education; [employer] brochure Tools: Pump; Flanges Pump Education: Setup, frequency, and cleaning; Milk Storage  Plan: Encouraged pumping every 3 hours, ideally 8 pumping sessions/24 hours Breast massage and hand expression were also encouraged prior pumping She'll call for assistance when she's ready to take baby to breast  FOB present and supportive. All questions and concerns answered, family to contact [facility] services PRN.  Consult Status: NICU follow-up NICU Follow-up type: New admission follow up   Nickson Middlesworth Venetia Constable 09/06/2022, 6:53 PM

## 2022-09-06 NOTE — Lactation Note (Signed)
This note was copied from a baby's chart. Lactation Consultation Note  Patient Name: Katherine Ross ZOXWR'U Date: 09/06/2022 Age:24 hours   Attemped to visit with Katherine Ross, twice, the first time at noon (she was on the phone) and the second time at 3:30 pm; but she was asleep. DEBP already set up in the room but per RN Heather P. she hasn't started pumping yet. Asked RN to call LC once mom is awake for initial [facility] consult.    Dhana Totton S Satish Hammers 09/06/2022, 4:12 PM

## 2022-09-06 NOTE — Progress Notes (Signed)
POSTPARTUM PROGRESS NOTE  Post Partum Day 1  Subjective:  Katherine Ross is a 22 y.o. G1P1001 s/p VD at [redacted]w[redacted]d.  She reports she is doing well. No acute events overnight. She denies any problems with ambulating, voiding or po intake. Denies nausea or vomiting.  Pain is moderately controlled.  Lochia is adequate.  Objective: Blood pressure 119/80, pulse (!) 123, temperature 98.3 F (36.8 C), temperature source Oral, resp. rate 17, height 5\' 5"  (1.651 m), weight 95.3 kg, last menstrual period 11/20/2021, SpO2 100 %, unknown if currently breastfeeding.  Physical Exam:  General: alert, cooperative and no distress Chest: no respiratory distress Heart:regular rate, distal pulses intact Uterine Fundus: firm, appropriately tender DVT Evaluation: No calf swelling or tenderness Extremities: no edema Skin: warm, dry  Recent Labs    09/04/22 0623 09/06/22 0453  HGB 11.2* 7.9*  HCT 33.7* 23.5*    Assessment/Plan: Katherine Ross is a 22 y.o. G1P1001 s/p VD at [redacted]w[redacted]d   PPD#1 - Doing well  Routine postpartum care  Adjusted pain medications PPH: Hgb 7.9, will add venofer Contraception: POPs Feeding: Breast/bottle Dispo: Plan for discharge tomorrow.   LOS: 2 days   Myrtie Hawk, DO OB Fellow  09/06/2022, 11:17 AM

## 2022-09-07 ENCOUNTER — Other Ambulatory Visit (HOSPITAL_COMMUNITY): Payer: Self-pay

## 2022-09-07 MED ORDER — FERROUS SULFATE 324 MG PO TBEC
324.0000 mg | DELAYED_RELEASE_TABLET | ORAL | 1 refills | Status: DC
  Filled 2022-09-07: qty 100, 200d supply, fill #0

## 2022-09-07 MED ORDER — NORETHINDRONE 0.35 MG PO TABS
1.0000 | ORAL_TABLET | Freq: Every day | ORAL | 11 refills | Status: DC
  Filled 2022-09-07: qty 28, 28d supply, fill #0

## 2022-09-07 MED ORDER — IBUPROFEN 600 MG PO TABS
600.0000 mg | ORAL_TABLET | Freq: Four times a day (QID) | ORAL | 0 refills | Status: DC
  Filled 2022-09-07: qty 30, 8d supply, fill #0

## 2022-09-08 ENCOUNTER — Ambulatory Visit (HOSPITAL_COMMUNITY): Payer: Self-pay

## 2022-09-08 NOTE — Lactation Note (Signed)
This note was copied from a baby's chart.  NICU Lactation Consultation Note  Patient Name: Boy Suhey Buccellato ZOXWR'U Date: 09/08/2022 Age:24 hours  Reason for consult: Follow-up assessment; Primapara; 1st time breastfeeding; NICU baby; Other (Comment); Mother's request; RN request; Term [facility] Health insurance)  SUBJECTIVE Visited with family of 68 hours old FT NICU female; Ms. Reasner is a P1 and reported she got discharge yesterday. She reports her breast are "hard and painful", she took baby to breast yesterday but noticed she's only pumped once/24 hours, baby currently on Similac 20 calorie formula. Provided ice packs and hands free pumping top (see maternal assessment) and assisted with pumping sessions during [facility] consultation. Baby "Pennie Banter" is getting discharged today. Reviewed discharge education, engorgement prevention/treatment, lactogenesis II/III, pump settings, feeding cues and anticipatory guidelines. Ms. Foran politely declined an [facility] OP referral, she has our contact info and will call us if needed. Encouraged consistent pumping every 3 hours even she puts baby to breast and to ice her breast as needed until engorgement resolves. FOB present and very supportive, he also assisted with breast massage while she was pumping. All questions and concerns answered, family to contact [facility] services PRN.  OBJECTIVE Infant data: Mother's Current Feeding Choice: Breast Milk and Formula  Infant feeding assessment Scale for Readiness: 1 Scale for Quality: 2   Maternal data: G1P1001  Vaginal, Spontaneous Pumping frequency: 1 time/24 hours Pumped volume: 70 mL Flange Size: 24  Pump: Employee Pump (Medela Freestyle hands free)  ASSESSMENT Infant: LATCH Documentation Latch: 2 Audible Swallowing: 2 Type of Nipple: 2 Comfort (Breast/Nipple): 2 Hold (Positioning): 2 LATCH Score: 10  Feeding Status: Ad lib  Maternal: Milk volume: Normal L side is engorged, R side is full, output from L side is  only a fraction of R side  INTERVENTIONS/PLAN Interventions: Interventions: Breast feeding basics reviewed; Breast massage; Hand express; Coconut oil; DEBP; Education Discharge Education: Engorgement and breast care; Outpatient recommendation Tools: Pump; Flanges; Coconut oil; Hands-free pumping top (Size XL) Pump Education: Setup, frequency, and cleaning; Milk Storage  Plan: Consult Status: Complete   Arvis Miguez S Felcia Huebert 09/08/2022, 2:00 PM

## 2022-09-09 ENCOUNTER — Other Ambulatory Visit: Payer: Self-pay

## 2022-09-10 ENCOUNTER — Other Ambulatory Visit (HOSPITAL_COMMUNITY): Payer: Self-pay

## 2022-09-10 ENCOUNTER — Other Ambulatory Visit: Payer: Self-pay

## 2022-09-10 NOTE — Anesthesia Postprocedure Evaluation (Signed)
Anesthesia Post Note  Patient: Katherine Ross  Procedure(s) Performed: AN AD HOC LABOR EPIDURAL     Patient location during evaluation: Mother Baby Anesthesia Type: Epidural Level of consciousness: awake and alert Pain management: pain level controlled Vital Signs Assessment: post-procedure vital signs reviewed and stable Respiratory status: spontaneous breathing, nonlabored ventilation and respiratory function stable Cardiovascular status: stable Postop Assessment: no headache, no backache and epidural receding Anesthetic complications: no   No notable events documented.  Last Vitals: There were no vitals filed for this visit.  Last Pain: There were no vitals filed for this visit.               Kennieth Rad

## 2022-09-11 LAB — SURGICAL PATHOLOGY

## 2022-09-12 ENCOUNTER — Encounter (HOSPITAL_BASED_OUTPATIENT_CLINIC_OR_DEPARTMENT_OTHER): Payer: Self-pay | Admitting: Obstetrics & Gynecology

## 2022-09-13 ENCOUNTER — Telehealth (HOSPITAL_COMMUNITY): Payer: Self-pay | Admitting: *Deleted

## 2022-09-13 NOTE — Telephone Encounter (Signed)
Left phone voicemail message.  Duffy Rhody, RN 09-13-2022 at 12;15pm

## 2022-10-15 ENCOUNTER — Ambulatory Visit (HOSPITAL_BASED_OUTPATIENT_CLINIC_OR_DEPARTMENT_OTHER): Payer: 59 | Admitting: Obstetrics & Gynecology

## 2022-10-15 ENCOUNTER — Encounter (HOSPITAL_BASED_OUTPATIENT_CLINIC_OR_DEPARTMENT_OTHER): Payer: Self-pay

## 2022-10-16 ENCOUNTER — Ambulatory Visit (HOSPITAL_BASED_OUTPATIENT_CLINIC_OR_DEPARTMENT_OTHER): Payer: 59 | Admitting: Obstetrics & Gynecology

## 2022-10-22 ENCOUNTER — Ambulatory Visit (INDEPENDENT_AMBULATORY_CARE_PROVIDER_SITE_OTHER): Payer: 59 | Admitting: Advanced Practice Midwife

## 2022-10-22 ENCOUNTER — Encounter (HOSPITAL_BASED_OUTPATIENT_CLINIC_OR_DEPARTMENT_OTHER): Payer: Self-pay | Admitting: Advanced Practice Midwife

## 2022-10-22 VITALS — BP 122/76 | HR 79 | Ht 64.0 in | Wt 203.2 lb

## 2022-10-22 DIAGNOSIS — J4531 Mild persistent asthma with (acute) exacerbation: Secondary | ICD-10-CM | POA: Diagnosis not present

## 2022-10-22 DIAGNOSIS — J4530 Mild persistent asthma, uncomplicated: Secondary | ICD-10-CM

## 2022-10-22 DIAGNOSIS — Z30430 Encounter for insertion of intrauterine contraceptive device: Secondary | ICD-10-CM

## 2022-10-22 DIAGNOSIS — R5383 Other fatigue: Secondary | ICD-10-CM | POA: Diagnosis not present

## 2022-10-22 DIAGNOSIS — Z8759 Personal history of other complications of pregnancy, childbirth and the puerperium: Secondary | ICD-10-CM | POA: Diagnosis not present

## 2022-10-22 LAB — CBC
Hematocrit: 37.2 % (ref 34.0–46.6)
Hemoglobin: 11.2 g/dL (ref 11.1–15.9)
MCH: 24 pg — ABNORMAL LOW (ref 26.6–33.0)
MCHC: 30.1 g/dL — ABNORMAL LOW (ref 31.5–35.7)
MCV: 80 fL (ref 79–97)
Platelets: 402 10*3/uL (ref 150–450)
RBC: 4.67 x10E6/uL (ref 3.77–5.28)
RDW: 13.9 % (ref 11.7–15.4)
WBC: 6 10*3/uL (ref 3.4–10.8)

## 2022-10-22 MED ORDER — FLUTICASONE PROPIONATE HFA 44 MCG/ACT IN AERO
2.0000 | INHALATION_SPRAY | Freq: Two times a day (BID) | RESPIRATORY_TRACT | 1 refills | Status: DC
Start: 2022-10-22 — End: 2022-10-24

## 2022-10-22 MED ORDER — LEVONORGESTREL 20 MCG/DAY IU IUD
1.00 | INTRAUTERINE_SYSTEM | Freq: Once | INTRAUTERINE | Status: AC
Start: 2022-10-22 — End: 2022-10-22
  Administered 2022-10-22: 1 via INTRAUTERINE

## 2022-10-22 MED ORDER — ALBUTEROL SULFATE HFA 108 (90 BASE) MCG/ACT IN AERS
2.0000 | INHALATION_SPRAY | RESPIRATORY_TRACT | 1 refills | Status: DC | PRN
Start: 2022-10-22 — End: 2022-10-24

## 2022-10-22 NOTE — Progress Notes (Signed)
Post Partum Visit Note  Katherine Ross is a 22 y.o. G68P1001 female who presents for a postpartum visit. She is 6 weeks postpartum following a normal spontaneous vaginal delivery.  I have fully reviewed the prenatal and intrapartum course. The delivery was at 41 gestational weeks.  Anesthesia: epidural. Postpartum course has been normal. Baby is doing well. Baby is feeding by breast. Bleeding no bleeding. Bowel function is normal. Bladder function is normal. Patient is not sexually active. Contraception method is none. Postpartum depression screening: negative.   The pregnancy intention screening data noted above was reviewed. Potential methods of contraception were discussed. The patient elected to proceed with No data recorded.    Health Maintenance Due  Topic Date Due   COVID-19 Vaccine (3 - 2023-24 season) 12/14/2021    The following portions of the patient's history were reviewed and updated as appropriate: allergies, current medications, past family history, past medical history, past social history, past surgical history, and problem list.  Review of Systems Pertinent items noted in HPI and remainder of comprehensive ROS otherwise negative.  Objective:  BP 122/76   Pulse 79   Ht 5\' 4"  (1.626 m)   Wt 203 lb 3.2 oz (92.2 kg)   LMP 11/20/2021   Breastfeeding Yes   BMI 34.88 kg/m    VS reviewed, nursing note reviewed,  Constitutional: well developed, well nourished, no distress HEENT: normocephalic CV: normal rate Pulm/chest wall: normal effort Abdomen: soft Neuro: alert and oriented x 3 Skin: warm, dry Psych: affect normal   Assessment:    1. Mild persistent asthma, uncomplicated --Pt unsure which inhaler to use daily and which to use as needed. --Reviewed plan to use Flovent BID and ProAir PRN.  Pt to follow up with PCP.  - albuterol (PROAIR HFA) 108 (90 Base) MCG/ACT inhaler; Inhale 2 puffs into the lungs every 4 (four) hours as needed for wheezing or shortness  of breath.  Dispense: 6.7 g; Refill: 1  2. Mild persistent allergic asthma with acute exacerbation  - fluticasone (FLOVENT HFA) 44 MCG/ACT inhaler; Inhale 2 puffs into the lungs 2 (two) times daily.  Dispense: 10.6 g; Refill: 1  3. Other fatigue --PPH with 1000 ml blood loss. IV iron given PP. - CBC  4. Postpartum hemorrhage, unspecified type  - CBC  5. Encounter for insertion of mirena IUD --Discussed pt contraceptive plans and reviewed contraceptive methods based on pt preferences and effectiveness.  Pt prefers IUD today.  - levonorgestrel (MIRENA) 20 MCG/DAY IUD 1 each    IUD Procedure Note Patient identified, informed consent performed.  Discussed risks of irregular bleeding, cramping, infection, malpositioning or misplacement of the IUD outside the uterus which may require further procedures. Time out was performed.  Urine pregnancy test negative.  Speculum placed in the vagina.  Cervix visualized.  Cleaned with Betadine x 2.  Grasped anteriorly with a single tooth tenaculum.  Uterus sounded to 6 cm.  Mirena IUD placed per manufacturer's recommendations.  Strings trimmed to 3 cm. Tenaculum was removed, good hemostasis noted.  Patient tolerated procedure well.   Patient was given post-procedure instructions and the Mirena care card with expiration date.  Patient was also asked to check IUD strings periodically and follow up in 4-6 weeks for IUD check.   Plan:   Essential components of care per ACOG recommendations:  1.  Mood and well being: Patient with negative depression screening today. Reviewed local resources for support.  - Patient tobacco use? No.   -  hx of drug use? No.    2. Infant care and feeding:  -Patient currently breastmilk feeding? Yes. Reviewed importance of draining breast regularly to support lactation.  -Social determinants of health (SDOH) reviewed in EPIC. No concerns   3. Sexuality, contraception and birth spacing - Patient does not want a pregnancy  in the next year.  - Reviewed reproductive life planning. Reviewed contraceptive methods based on pt preferences and effectiveness.  Patient desired IUD or IUS today.   - Discussed birth spacing of 18 months  4. Sleep and fatigue -Encouraged family/partner/community support of 4 hrs of uninterrupted sleep to help with mood and fatigue  5. Physical Recovery  - Discussed patients delivery and complications. She describes her labor as mixed. - Patient had a Vaginal problems after delivery including postpartum hemorrhage . Patient had a 1st degree laceration. Perineal healing reviewed. Patient expressed understanding - Patient has urinary incontinence? No. - Patient is safe to resume physical and sexual activity  6.  Health Maintenance - HM due items addressed Yes - Last pap smear  Diagnosis  Date Value Ref Range Status  02/21/2022   Final   - Negative for intraepithelial lesion or malignancy (NILM)   Pap smear not done at today's visit.  -Breast Cancer screening indicated? No.   7. Chronic Disease/Pregnancy Condition follow up:  asthma  - PCP follow up  Sharen Counter, CNM [facility] [employer], [facility]

## 2022-10-24 ENCOUNTER — Other Ambulatory Visit (HOSPITAL_BASED_OUTPATIENT_CLINIC_OR_DEPARTMENT_OTHER): Payer: Self-pay | Admitting: *Deleted

## 2022-10-24 ENCOUNTER — Other Ambulatory Visit (HOSPITAL_COMMUNITY): Payer: Self-pay

## 2022-10-24 DIAGNOSIS — J4530 Mild persistent asthma, uncomplicated: Secondary | ICD-10-CM

## 2022-10-24 DIAGNOSIS — J4531 Mild persistent asthma with (acute) exacerbation: Secondary | ICD-10-CM

## 2022-10-24 MED ORDER — ALBUTEROL SULFATE HFA 108 (90 BASE) MCG/ACT IN AERS
2.0000 | INHALATION_SPRAY | RESPIRATORY_TRACT | 1 refills | Status: DC | PRN
Start: 2022-10-24 — End: 2022-11-01
  Filled 2022-10-24: qty 6.7, 25d supply, fill #0

## 2022-10-24 MED ORDER — FLUTICASONE PROPIONATE HFA 44 MCG/ACT IN AERO
2.0000 | INHALATION_SPRAY | Freq: Two times a day (BID) | RESPIRATORY_TRACT | 1 refills | Status: DC
Start: 2022-10-24 — End: 2022-11-01
  Filled 2022-10-24: qty 10.6, 30d supply, fill #0

## 2022-10-25 ENCOUNTER — Other Ambulatory Visit (HOSPITAL_COMMUNITY): Payer: Self-pay

## 2022-11-01 ENCOUNTER — Ambulatory Visit (INDEPENDENT_AMBULATORY_CARE_PROVIDER_SITE_OTHER): Payer: 59 | Admitting: Internal Medicine

## 2022-11-01 ENCOUNTER — Other Ambulatory Visit (HOSPITAL_COMMUNITY): Payer: Self-pay

## 2022-11-01 VITALS — BP 104/63 | HR 83 | Temp 97.7°F | Ht 64.0 in | Wt 204.6 lb

## 2022-11-01 DIAGNOSIS — J4530 Mild persistent asthma, uncomplicated: Secondary | ICD-10-CM

## 2022-11-01 DIAGNOSIS — J302 Other seasonal allergic rhinitis: Secondary | ICD-10-CM

## 2022-11-01 MED ORDER — FLUTICASONE PROPIONATE 50 MCG/ACT NA SUSP
1.0000 | Freq: Every day | NASAL | 2 refills | Status: AC
Start: 2022-11-01 — End: ?
  Filled 2022-11-01: qty 16, 60d supply, fill #0
  Filled 2023-04-21 (×2): qty 16, 60d supply, fill #1

## 2022-11-01 MED ORDER — FLUTICASONE PROPIONATE HFA 110 MCG/ACT IN AERO
2.0000 | INHALATION_SPRAY | Freq: Two times a day (BID) | RESPIRATORY_TRACT | 2 refills | Status: DC
Start: 2022-11-01 — End: 2022-11-01
  Filled 2022-11-01: qty 1, fill #0

## 2022-11-01 MED ORDER — FLUTICASONE PROPIONATE HFA 110 MCG/ACT IN AERO
2.0000 | INHALATION_SPRAY | Freq: Two times a day (BID) | RESPIRATORY_TRACT | 2 refills | Status: DC
Start: 2022-11-01 — End: 2023-07-16
  Filled 2022-11-01: qty 12, 30d supply, fill #0
  Filled 2023-01-24: qty 12, 30d supply, fill #1
  Filled 2023-04-21 (×2): qty 12, 30d supply, fill #2

## 2022-11-01 MED ORDER — FLUTICASONE PROPIONATE HFA 110 MCG/ACT IN AERO
1.0000 | INHALATION_SPRAY | Freq: Two times a day (BID) | RESPIRATORY_TRACT | 2 refills | Status: DC
Start: 2022-11-01 — End: 2022-11-01
  Filled 2022-11-01: qty 1, fill #0

## 2022-11-01 NOTE — Assessment & Plan Note (Signed)
Allergy testing completed in 2011. She started taking zyrtec a few weeks ago and continues to have some symptoms with runny nose. P: Continue zyrtec Start flonase nasal spray

## 2022-11-01 NOTE — Patient Instructions (Addendum)
Thank you, Ms.Katherine Ross for allowing [place] to provide your care today.   Asthma I increased your dose of flovent inhaler. This is a steroid inhaler that you should be using 2 times daily. I recommend using it before brushing your teeth. Continue using albuterol inhaler as needed. I printed off 2 packets that talk about asthma prevention and an action plan.  Allergies Continue with zyrtec. Please start using flonase daily to see if this helps with allergies.  I have ordered the following medication/changed the following medications:   Stop the following medications: Medications Discontinued During This Encounter  Medication Reason   fluticasone (FLOVENT HFA) 44 MCG/ACT inhaler      Start the following medications: Meds ordered this encounter  Medications   fluticasone (FLOVENT HFA) 110 MCG/ACT inhaler    Sig: Inhale 1 puff into the lungs 2 (two) times daily.    Dispense:  1 each    Refill:  2   fluticasone (FLONASE) 50 MCG/ACT nasal spray    Sig: Place 1 spray into both nostrils daily.    Dispense:  9.9 mL    Refill:  2    We look forward to seeing you next time. Please call our clinic at [phone] if you have any questions or concerns. The best time to call is Monday-Friday from 9am-4pm, but there is someone available 24/7. If after hours or the weekend, call the main hospital number and ask for the Internal Medicine Resident On-Call. If you need medication refills, please notify your pharmacy one week in advance and they will send [place] a request.   Thank you for trusting me with your care. Wishing you the best!   Rudene Christians, DO [facility]

## 2022-11-01 NOTE — Assessment & Plan Note (Signed)
She currently uses a low-dose fluticasone inhaler 2 times daily. She has an albuterol inhaler but uses this infrequently about 1-2 times weekly as rescue inhaler. She denies nocturnal awakenings and is not limited with activities due to shortness of breath. She has never been treated for an asthma exacerbation. She smoked prior to learning she was pregnant but has not done so in greater than 9 months.  On exam she does have mild wheezing present to lower lung fields bilaterally but is breathing comfortably. P: Will increase fluticasone to medium dose, flovent 2 puffs BID Budesonide-fomerterol (symbicort) would be ideal inhaler, but there is a lactation warning for budesonide. I reviewed this with Epsie. Will plan to try higher dose of flovent and have her follow-up in 4 weeks.

## 2022-11-01 NOTE — Progress Notes (Signed)
Subjective:  CC: wheezing  HPI:  Ms.Katherine Ross is a 22 y.o. female with a past medical history stated below and presents today for wheezing. She gave birth about 6 weeks ago and is currently breast feeding. She was diagnosed with asthma as a child but did not have symptoms until she was pregnant. She has difficulty with wheezing and shortness of breath. She feels that since giving birth her symptoms have improved. She no longer feels short of breath but continues to have some wheezing at times. She followed with allergy and asthma center when she was a teenager and last spirometry in 2018 showed mild obstruction.  Please see problem based assessment and plan for additional details.  Past Medical History:  Diagnosis Date   Asthma    Eczema     Current Outpatient Medications on File Prior to Visit  Medication Sig Dispense Refill   cetirizine (ZYRTEC) 10 MG tablet Take 10 mg by mouth daily.     ferrous sulfate 324 MG TBEC Take 1 tablet (324 mg total) by mouth every other day. 30 tablet 1   ibuprofen (ADVIL) 600 MG tablet Take 1 tablet (600 mg total) by mouth every 6 (six) hours. 30 tablet 0   Prenatal Vit-Fe Fumarate-FA (MULTIVITAMIN-PRENATAL) 27-0.8 MG TABS tablet Take 1 tablet by mouth daily.     No current facility-administered medications on file prior to visit.    Family History  Problem Relation Age of Onset   Diabetes Mother    Hypertension Mother    Hyperlipidemia Mother    Food Allergy Brother        tree nuts   Autism Brother    Schizophrenia Brother    Hyperlipidemia Maternal Grandmother    Hypertension Maternal Grandmother    Diabetes Maternal Grandmother    Allergic rhinitis Maternal Grandmother    Hypothyroidism Maternal Grandmother    Heart attack Maternal Grandfather    Angioedema Neg Hx    Asthma Neg Hx    Atopy Neg Hx    Eczema Neg Hx    Immunodeficiency Neg Hx    Urticaria Neg Hx     Social History   Socioeconomic History   Marital status:  Single    Spouse name: Not on file   Number of children: Not on file   Years of education: Not on file   Highest education level: Not on file  Occupational History   Not on file  Tobacco Use   Smoking status: Never    Passive exposure: Never   Smokeless tobacco: Never  Vaping Use   Vaping status: Never Used  Substance and Sexual Activity   Alcohol use: Not Currently    Comment: social   Drug use: Not Currently    Types: Marijuana    Comment: last use weed years ago   Sexual activity: Not Currently    Birth control/protection: None  Other Topics Concern   Not on file  Social History Narrative   Not on file   Social Determinants of Health   Financial Resource Strain: Not on file  Food Insecurity: No Food Insecurity (09/04/2022)   Hunger Vital Sign    Worried About Running Out of Food in the Last Year: Never true    Ran Out of Food in the Last Year: Never true  Transportation Needs: No Transportation Needs (09/04/2022)   PRAPARE - Administrator, Civil Service (Medical): No    Lack of Transportation (Non-Medical): No  Physical Activity: Not  on file  Stress: Not on file  Social Connections: Not on file  Intimate Partner Violence: Not At Risk (09/04/2022)   Humiliation, Afraid, Rape, and Kick questionnaire    Fear of Current or Ex-Partner: No    Emotionally Abused: No    Physically Abused: No    Sexually Abused: No    Review of Systems: ROS negative except for what is noted on the assessment and plan.  Objective:   Vitals:   11/01/22 0838  BP: 104/63  Pulse: 83  Temp: 97.7 F (36.5 C)  TempSrc: Oral  SpO2: 98%  Weight: 204 lb 9.6 oz (92.8 kg)  Height: 5\' 4"  (1.626 m)    Physical Exam: Constitutional: well-appearing  HENT: normocephalic atraumatic, mucous membranes moist Eyes: conjunctiva non-erythematous Neck: supple Cardiovascular: regular rate and rhythm, no m/r/g Pulmonary/Chest: normal work of breathing on room air, mild wheezing present  to lower lung fields bilaterally MSK: normal bulk and tone Skin: warm and dry   Assessment & Plan:  Asthma, mild persistent She currently uses a low-dose fluticasone inhaler 2 times daily. She has an albuterol inhaler but uses this infrequently about 1-2 times weekly as rescue inhaler. She denies nocturnal awakenings and is not limited with activities due to shortness of breath. She has never been treated for an asthma exacerbation. She smoked prior to learning she was pregnant but has not done so in greater than 9 months.  On exam she does have mild wheezing present to lower lung fields bilaterally but is breathing comfortably. P: Will increase fluticasone to medium dose, flovent 2 puffs BID Budesonide-fomerterol (symbicort) would be ideal inhaler, but there is a lactation warning for budesonide. I reviewed this with Yeraldin. Will plan to try higher dose of flovent and have her follow-up in 4 weeks.  Seasonal allergies Allergy testing completed in 2011. She started taking zyrtec a few weeks ago and continues to have some symptoms with runny nose. P: Continue zyrtec Start flonase nasal spray    Patient discussed with Dr. Cleda Daub   Treysen Sudbeck, D.O. [facility]  PGY-3 Pager: [phone]  Phone: [phone] Date 11/01/2022  Time 5:08 PM

## 2022-11-29 ENCOUNTER — Encounter (HOSPITAL_BASED_OUTPATIENT_CLINIC_OR_DEPARTMENT_OTHER): Payer: Self-pay | Admitting: Obstetrics & Gynecology

## 2023-01-14 ENCOUNTER — Ambulatory Visit (INDEPENDENT_AMBULATORY_CARE_PROVIDER_SITE_OTHER): Payer: 59 | Admitting: Certified Nurse Midwife

## 2023-01-14 ENCOUNTER — Other Ambulatory Visit (HOSPITAL_COMMUNITY): Payer: Self-pay

## 2023-01-14 ENCOUNTER — Encounter (HOSPITAL_BASED_OUTPATIENT_CLINIC_OR_DEPARTMENT_OTHER): Payer: Self-pay | Admitting: Advanced Practice Midwife

## 2023-01-14 VITALS — BP 103/75 | HR 55 | Ht 64.0 in | Wt 196.0 lb

## 2023-01-14 DIAGNOSIS — N921 Excessive and frequent menstruation with irregular cycle: Secondary | ICD-10-CM

## 2023-01-14 DIAGNOSIS — Z30431 Encounter for routine checking of intrauterine contraceptive device: Secondary | ICD-10-CM

## 2023-01-14 DIAGNOSIS — B359 Dermatophytosis, unspecified: Secondary | ICD-10-CM | POA: Diagnosis not present

## 2023-01-14 MED ORDER — CLOTRIMAZOLE-BETAMETHASONE 1-0.05 % EX CREA
1.0000 | TOPICAL_CREAM | Freq: Two times a day (BID) | CUTANEOUS | 0 refills | Status: AC
Start: 2023-01-14 — End: ?
  Filled 2023-01-14 – 2023-01-24 (×2): qty 30, 15d supply, fill #0

## 2023-01-14 NOTE — Progress Notes (Signed)
     GYNECOLOGY PROGRESS NOTE  History:   Goes by "Kimmie"   22 y.o. G1P1001 presents to [facility]  [place] office today for problem gyn visit. Accompanied by her Mom and her son.   She is 4 months postpartum after Term SVD  son "Pennie Banter" 8lb 2oz., currently breastfeeding. Pt states she never really "quit"  bleeding since delivery. Reports she has had spotting or bleeding every day since delivery. Mirena IUD was inserted at 6 week postpartum visit. Bleeding has continued with the addition of cramping. Baby breastfeeding. She hasn't had intercourse since delivery due to the bleeding. No fever or chills. No foul odor reported. No abdominal tenderness or pelvic tenderness.   Pt just returned home after being in [place] for 6 weeks. They have family there. States she developed ringworm right lower leg while there and requests medication.   The following portions of the patient's history were reviewed and updated as appropriate: allergies, current medications, past family history, past medical history, past social history, past surgical history and problem list.   Health Maintenance Due  Topic Date Due   INFLUENZA VACCINE  11/14/2022   COVID-19 Vaccine (3 - 2023-24 season) 12/15/2022     Review of Systems:  Pertinent items are noted in HPI.   Objective:  Physical Exam Blood pressure 103/75, pulse (!) 55, height 5\' 4"  (1.626 m), weight 196 lb (88.9 kg), currently breastfeeding. VS reviewed, nursing note reviewed,  Constitutional: well developed, well nourished, no distress HEENT: normocephalic CV: normal rate Pulm/chest wall: normal effort Breast Exam: deferred Abdomen: soft Neuro: alert and oriented x 3 Skin: warm, dry Psych: affect normal Pelvic exam: Cervix pink, visually closed, without lesion. IUD strings visible 2.5cm. Small amount blood present in vagina.  Bimanual exam: IUD strings visible and palpable, Cervix 0/long/high, firm, anterior, neg CMT, uterus nontender, nonenlarged,  adnexa without tenderness, enlargement, or mass  Assessment & Plan:  1. Excessive, frequent and irregular menstruation -Discussed with patient reasons for irregular bleeding could be r/t postpartum, breastfeeding, progesterone-containing IUD, hormonal, or possible retained products after delivery.  -RTO for GYN [place] to confirm correct location of Mirena IUD and rule out retained POC - CBC (hx anemia in addition to bleeding) - Beta hCG quant (ref lab)-should be completely negative/zero  2. Contraceptive, surveillance, intrauterine device -IUD appears (on spec exam) to be in proper location -[place] to confirm IUD in correct location within uterine cavity -She prefers to leave IUD in place at this time in hopes that bleeding will resolve  3. Ringworm (right lower leg) - clotrimazole-betamethasone (LOTRISONE) cream; Apply 1 Application topically 2 (two) times daily.  Dispense: 30 g; Refill: 0   Return for Schedule Pelvic GYN [place] (?tomorrow) for postpartum bleeding x 3 months and IUD check.   [name], CNM 11:35 AM

## 2023-01-15 LAB — CBC
Hematocrit: 38.7 % (ref 34.0–46.6)
Hemoglobin: 12.2 g/dL (ref 11.1–15.9)
MCH: 25.3 pg — ABNORMAL LOW (ref 26.6–33.0)
MCHC: 31.5 g/dL (ref 31.5–35.7)
MCV: 80 fL (ref 79–97)
Platelets: 333 10*3/uL (ref 150–450)
RBC: 4.83 x10E6/uL (ref 3.77–5.28)
RDW: 15.8 % — ABNORMAL HIGH (ref 11.7–15.4)
WBC: 4.3 10*3/uL (ref 3.4–10.8)

## 2023-01-15 LAB — BETA HCG QUANT (REF LAB): hCG Quant: <1 m[IU]/mL

## 2023-01-24 ENCOUNTER — Other Ambulatory Visit (HOSPITAL_COMMUNITY): Payer: Self-pay

## 2023-01-29 ENCOUNTER — Other Ambulatory Visit (HOSPITAL_BASED_OUTPATIENT_CLINIC_OR_DEPARTMENT_OTHER): Payer: Self-pay | Admitting: Certified Nurse Midwife

## 2023-01-29 ENCOUNTER — Ambulatory Visit (HOSPITAL_BASED_OUTPATIENT_CLINIC_OR_DEPARTMENT_OTHER): Payer: 59

## 2023-01-29 ENCOUNTER — Ambulatory Visit (HOSPITAL_BASED_OUTPATIENT_CLINIC_OR_DEPARTMENT_OTHER): Payer: 59 | Admitting: Certified Nurse Midwife

## 2023-01-29 VITALS — BP 118/55 | HR 102 | Ht 64.0 in | Wt 199.2 lb

## 2023-01-29 DIAGNOSIS — N921 Excessive and frequent menstruation with irregular cycle: Secondary | ICD-10-CM

## 2023-01-29 DIAGNOSIS — T8332XA Displacement of intrauterine contraceptive device, initial encounter: Secondary | ICD-10-CM

## 2023-01-29 DIAGNOSIS — Z30433 Encounter for removal and reinsertion of intrauterine contraceptive device: Secondary | ICD-10-CM

## 2023-01-29 DIAGNOSIS — Z30431 Encounter for routine checking of intrauterine contraceptive device: Secondary | ICD-10-CM

## 2023-01-29 MED ORDER — LEVONORGESTREL 20 MCG/DAY IU IUD
1.0000 | INTRAUTERINE_SYSTEM | Freq: Once | INTRAUTERINE | Status: AC
Start: 2023-01-29 — End: 2023-01-29
  Administered 2023-01-29: 1 via INTRAUTERINE

## 2023-01-29 NOTE — Progress Notes (Signed)
  Katherine Ross is a G1P1 here for follow-up US. She presented to our clinic 01/14/23 with complaints of bothersome vaginal spotting and bleeding since the birth of her son 4 months ago. She was brought in for [place] today to confirm IUD location and rule out retained products of conception.   EXAM: TRANSVAGINAL ULTRASOUND OF PELVIS   TECHNIQUE: Transvaginal ultrasound examination of the pelvis was performed for imaging of the pelvis including uterus, ovaries, adnexal regions, and pelvic cul-de-sac.     FINDINGS: Uterus: 7.7 x 6.4 x 4.2cm with IUD in lower half of uterus, arms possibly extended in the myometrium.   Volume:   Endometrial thickness:  4.14mm   Right ovary: 3.4 x 2.4 x 2.5cm.  Volume:  11.75ml   Left ovary:  2.1 x 2.9 x 3.8cm.  Volume:  12.25ml.  Both ovaries with PCOS appearance.     Other findings:  No abnormal free fluid.  Cervix WNL   Discussed with patient findings on [place] with IUD arms possibly extending into myometrium. Given her c/o prolonged bleeding, pt ultimately decided to remove Mirena and replace with new Mirena IUD.      GYNECOLOGY OFFICE PROCEDURE NOTE  [name] is a 22 y.o. G1P1001 here for Mirena IUD insertion. No GYN concerns.   IUD Insertion Procedure Note Patient identified, informed consent performed, consent signed.   Discussed risks of irregular bleeding, cramping, infection, malpositioning or misplacement of the IUD outside the uterus which may require further procedure such as laparoscopy. Time out was performed.  Urine pregnancy test negative.  Speculum placed in the vagina.  Cervix visualized.  Cleaned with Betadine x 2.  Uterus sounded to 5 cm.  Mirena IUD placed per manufacturer's recommendations.  Strings trimmed to 3 cm.   Patient tolerated procedure well.   Patient was given post-procedure instructions. She will follow-up in 1-2 months for IUD check.   [name], CNM 5:01 PM

## 2023-02-08 ENCOUNTER — Encounter (HOSPITAL_BASED_OUTPATIENT_CLINIC_OR_DEPARTMENT_OTHER): Payer: Self-pay | Admitting: Certified Nurse Midwife

## 2023-02-11 ENCOUNTER — Other Ambulatory Visit (HOSPITAL_COMMUNITY): Payer: Self-pay

## 2023-02-11 ENCOUNTER — Ambulatory Visit (INDEPENDENT_AMBULATORY_CARE_PROVIDER_SITE_OTHER): Payer: 59 | Admitting: Certified Nurse Midwife

## 2023-02-11 ENCOUNTER — Encounter (HOSPITAL_BASED_OUTPATIENT_CLINIC_OR_DEPARTMENT_OTHER): Payer: Self-pay | Admitting: Certified Nurse Midwife

## 2023-02-11 VITALS — BP 109/76 | HR 60 | Ht 64.0 in | Wt 201.0 lb

## 2023-02-11 DIAGNOSIS — Z30432 Encounter for removal of intrauterine contraceptive device: Secondary | ICD-10-CM | POA: Diagnosis not present

## 2023-02-11 DIAGNOSIS — Z30015 Encounter for initial prescription of vaginal ring hormonal contraceptive: Secondary | ICD-10-CM | POA: Diagnosis not present

## 2023-02-11 MED ORDER — IBUPROFEN 800 MG PO TABS
800.0000 mg | ORAL_TABLET | Freq: Three times a day (TID) | ORAL | 1 refills | Status: DC | PRN
  Filled 2023-02-11: qty 60, 20d supply, fill #0

## 2023-02-11 MED ORDER — ETONOGESTREL-ETHINYL ESTRADIOL 0.12-0.015 MG/24HR VA RING
1.0000 | VAGINAL_RING | VAGINAL | 12 refills | Status: DC
  Filled 2023-02-11: qty 3, 63d supply, fill #0
  Filled 2023-04-21 (×2): qty 3, 63d supply, fill #1
  Filled 2023-07-16 – 2023-08-30 (×2): qty 3, 63d supply, fill #2
  Filled 2023-09-01: qty 3, 63d supply, fill #0
  Filled 2023-10-26 – 2023-10-27 (×2): qty 3, 63d supply, fill #1

## 2023-02-11 NOTE — Progress Notes (Signed)
  GYNECOLOGY  VISIT  CC:   Pt had new Mirena IUD inserted 01/29/23. She c/o heavy bleeding every day since then. Would like IUD removed. Interested in starting NuvaRing.  HPI: 22 y.o. G66P1001 Single Black or [place] American female here for removal of IUD and start NuvaRing.   Past Medical History:  Diagnosis Date   Asthma    Eczema     MEDS:   Current Outpatient Medications on File Prior to Visit  Medication Sig Dispense Refill   cetirizine (ZYRTEC) 10 MG tablet Take 10 mg by mouth daily.     clotrimazole-betamethasone (LOTRISONE) cream Apply 1 Application topically 2 (two) times daily. 30 g 0   ferrous sulfate 324 MG TBEC Take 1 tablet (324 mg total) by mouth every other day. 30 tablet 1   fluticasone (FLONASE) 50 MCG/ACT nasal spray Place 1 spray into both nostrils daily. 16 g 2   fluticasone (FLOVENT HFA) 110 MCG/ACT inhaler Inhale 2 puffs into the lungs 2 (two) times daily. 12 g 2   ibuprofen (ADVIL) 600 MG tablet Take 1 tablet (600 mg total) by mouth every 6 (six) hours. 30 tablet 0   Prenatal Vit-Fe Fumarate-FA (MULTIVITAMIN-PRENATAL) 27-0.8 MG TABS tablet Take 1 tablet by mouth daily.     No current facility-administered medications on file prior to visit.    ALLERGIES: Apple and Penicillins   ROS  PHYSICAL EXAMINATION:    BP 109/76 (BP Location: Left Arm, Patient Position: Sitting, Cuff Size: Large)   Pulse 60   Ht 5\' 4"  (1.626 m)   Wt 201 lb (91.2 kg)   LMP  (LMP Unknown)   Breastfeeding Yes   BMI 34.50 kg/m     General appearance: alert, cooperative and appears stated age   Pelvic: External genitalia:  no lesions              Urethra:  normal appearing urethra with no masses, tenderness or lesions              Bartholins and Skenes: normal                 Vagina: normal without tenderness, induration or masses and blood present              Cervix: no cervical motion tenderness, no lesions, and IUD strings visible and palpable             Chaperone,  Hendricks Milo, CMA, was present for exam.  Assessment/Plan:  1. Encounter for removal of intrauterine contraceptive device -IUD strings palpable 2cm. Spec inserted. IUD strings visible 2cm. Sponge forcep used to grasp IUD strings and remove IUD. IUD seemed to be low in cervical canal and was likely in process of expulsion. Pt states she has not had intercourse over the past 2-3 weeks and does not think there is any way she could be pregnant. She would like to start NuvaRing today. Discussed monthly vs. Extended regimen w/ NuvaRing and pt prefers extended regimen NuvaRing. Rx sent for NuvaRing. Ibuprofen 800mg  po q8hrs over next 24-48hrs may help decrease bleeding. [name]

## 2023-02-25 ENCOUNTER — Other Ambulatory Visit (HOSPITAL_COMMUNITY)
Admission: RE | Admit: 2023-02-25 | Discharge: 2023-02-25 | Disposition: A | Discharge status: Home / Self Care | Payer: 59 | Source: Ambulatory Visit | Attending: Certified Nurse Midwife | Admitting: Certified Nurse Midwife

## 2023-02-25 ENCOUNTER — Encounter (HOSPITAL_BASED_OUTPATIENT_CLINIC_OR_DEPARTMENT_OTHER): Payer: Self-pay

## 2023-02-25 ENCOUNTER — Ambulatory Visit (HOSPITAL_BASED_OUTPATIENT_CLINIC_OR_DEPARTMENT_OTHER): Payer: 59

## 2023-02-25 VITALS — BP 127/75 | HR 75 | Ht 66.0 in | Wt 198.8 lb

## 2023-02-25 DIAGNOSIS — R3915 Urgency of urination: Secondary | ICD-10-CM | POA: Diagnosis not present

## 2023-02-25 DIAGNOSIS — N898 Other specified noninflammatory disorders of vagina: Secondary | ICD-10-CM | POA: Diagnosis not present

## 2023-02-25 LAB — POCT URINALYSIS DIPSTICK
Bilirubin, UA: NEGATIVE
Blood, UA: NEGATIVE
Glucose, UA: NEGATIVE
Ketones, UA: NEGATIVE
Leukocytes, UA: NEGATIVE
Nitrite, UA: NEGATIVE
Protein, UA: NEGATIVE
Spec Grav, UA: >=1.03 — AB (ref 1.010–1.025)
Urobilinogen, UA: 0.2 U/dL
pH, UA: 5.5 (ref 5.0–8.0)

## 2023-02-25 NOTE — Progress Notes (Unsigned)
Patient came in today with complaints of urinary urgency and vaginal itching. Patient gave a urine sample that was tested and sent out for culture. Aptima swab also sent for evaluation. tbw

## 2023-02-26 ENCOUNTER — Other Ambulatory Visit (HOSPITAL_COMMUNITY): Payer: Self-pay

## 2023-02-26 ENCOUNTER — Other Ambulatory Visit (HOSPITAL_BASED_OUTPATIENT_CLINIC_OR_DEPARTMENT_OTHER): Payer: Self-pay

## 2023-02-26 LAB — CERVICOVAGINAL ANCILLARY ONLY
Bacterial Vaginitis (gardnerella): NEGATIVE
Candida Glabrata: NEGATIVE
Candida Vaginitis: NEGATIVE
Comment: NEGATIVE
Comment: NEGATIVE
Comment: NEGATIVE

## 2023-02-26 LAB — URINE CULTURE

## 2023-02-26 MED ORDER — NITROFURANTOIN MONOHYD MACRO 100 MG PO CAPS
100.0000 mg | ORAL_CAPSULE | Freq: Two times a day (BID) | ORAL | 0 refills | Status: DC
  Filled 2023-02-26: qty 10, 5d supply, fill #0

## 2023-03-10 ENCOUNTER — Other Ambulatory Visit: Payer: Self-pay | Admitting: Podiatry

## 2023-03-10 ENCOUNTER — Other Ambulatory Visit (HOSPITAL_COMMUNITY): Payer: Self-pay

## 2023-03-10 MED ORDER — OXYCODONE-ACETAMINOPHEN 5-325 MG PO TABS
1.0000 | ORAL_TABLET | ORAL | 0 refills | Status: DC | PRN
Start: 2023-03-10 — End: 2023-07-16
  Filled 2023-03-10 – 2023-04-21 (×2): qty 30, 5d supply, fill #0

## 2023-03-10 MED ORDER — IBUPROFEN 800 MG PO TABS
800.0000 mg | ORAL_TABLET | Freq: Four times a day (QID) | ORAL | 1 refills | Status: DC | PRN
  Filled 2023-03-10 – 2023-04-21 (×3): qty 60, 15d supply, fill #0

## 2023-03-20 ENCOUNTER — Other Ambulatory Visit (HOSPITAL_COMMUNITY): Payer: Self-pay

## 2023-03-21 ENCOUNTER — Telehealth (HOSPITAL_BASED_OUTPATIENT_CLINIC_OR_DEPARTMENT_OTHER): Payer: Self-pay | Admitting: Certified Nurse Midwife

## 2023-03-21 ENCOUNTER — Ambulatory Visit (HOSPITAL_BASED_OUTPATIENT_CLINIC_OR_DEPARTMENT_OTHER): Payer: 59 | Admitting: Certified Nurse Midwife

## 2023-03-21 NOTE — Telephone Encounter (Signed)
Called patient and left a message to call the office back to reschedule the missed appointment .

## 2023-03-24 ENCOUNTER — Encounter (HOSPITAL_BASED_OUTPATIENT_CLINIC_OR_DEPARTMENT_OTHER): Payer: Self-pay | Admitting: Certified Nurse Midwife

## 2023-03-24 ENCOUNTER — Ambulatory Visit (HOSPITAL_BASED_OUTPATIENT_CLINIC_OR_DEPARTMENT_OTHER): Payer: 59 | Admitting: Certified Nurse Midwife

## 2023-03-24 VITALS — BP 125/80 | HR 81 | Ht 66.0 in | Wt 204.0 lb

## 2023-03-24 DIAGNOSIS — Z3049 Encounter for surveillance of other contraceptives: Secondary | ICD-10-CM

## 2023-03-24 NOTE — Progress Notes (Signed)
10/29 Started NuvaRing.   Patient started NuvaRing for contraception on 10/29. That was insertion of her 1st ring. She states "the bleeding stopped quickly" and she did not spot or bleeding for 21 days. On Day 21 she started to notice some spots when she wipes. She removed that 1st NuvaRing and inserted 2nd NuvaRing that day. She  has continued to notice some intermittent spotting when wiping but nothing on panties. She would like to continue NuvaRing, but wants to be sure that she isn't pregnant. UPT negative.  The following portions of the patient's history were reviewed and updated as appropriate: allergies, current medications, past family history, past medical history, past social history, past surgical history, and problem list.   Review of Systems Pertinent items are noted in HPI.    Objective:    BP 125/80 (BP Location: Left Arm, Patient Position: Sitting, Cuff Size: Normal)   Pulse 81   Ht 5\' 6"  (1.676 m)   Wt 204 lb (92.5 kg)   BMI 32.93 kg/m  General appearance: alert, cooperative, and appears stated age Pelvic: cervix normal in appearance, external genitalia normal, no adnexal masses or tenderness, no cervical motion tenderness, rectovaginal septum normal, uterus normal size, shape, and consistency, vagina normal without discharge, and no bleeding noted    Assessment:    Surveillance Contraceptive Ring .   Encounter for pregnancy test Plan:    Pt reassured that NuvaRing correctly placed, UPT negative. She will continue extended regimen NuvaRing.  [name]

## 2023-04-04 ENCOUNTER — Other Ambulatory Visit (HOSPITAL_COMMUNITY): Payer: Self-pay

## 2023-04-04 ENCOUNTER — Other Ambulatory Visit (HOSPITAL_BASED_OUTPATIENT_CLINIC_OR_DEPARTMENT_OTHER): Payer: Self-pay | Admitting: *Deleted

## 2023-04-04 MED ORDER — NORETHINDRONE ACETATE 5 MG PO TABS
5.0000 mg | ORAL_TABLET | Freq: Two times a day (BID) | ORAL | 1 refills | Status: DC
  Filled 2023-04-04: qty 60, 30d supply, fill #0
  Filled 2023-04-21: qty 60, 30d supply, fill #1

## 2023-04-21 ENCOUNTER — Other Ambulatory Visit (HOSPITAL_COMMUNITY): Payer: Self-pay

## 2023-05-01 ENCOUNTER — Other Ambulatory Visit (HOSPITAL_COMMUNITY): Payer: Self-pay

## 2023-05-16 ENCOUNTER — Telehealth (HOSPITAL_BASED_OUTPATIENT_CLINIC_OR_DEPARTMENT_OTHER): Payer: Self-pay | Admitting: *Deleted

## 2023-05-16 NOTE — Telephone Encounter (Signed)
Called patient and  left  message to please call the office back to schedule appointment.

## 2023-06-13 ENCOUNTER — Encounter: Payer: 59 | Admitting: Student

## 2023-07-16 ENCOUNTER — Telehealth: Payer: Self-pay

## 2023-07-16 ENCOUNTER — Ambulatory Visit: Admitting: Internal Medicine

## 2023-07-16 ENCOUNTER — Other Ambulatory Visit (HOSPITAL_COMMUNITY): Payer: Self-pay

## 2023-07-16 VITALS — BP 125/73 | HR 78 | Temp 98.5°F | Ht 66.0 in | Wt 206.7 lb

## 2023-07-16 DIAGNOSIS — Z6832 Body mass index (BMI) 32.0-32.9, adult: Secondary | ICD-10-CM | POA: Diagnosis not present

## 2023-07-16 DIAGNOSIS — L600 Ingrowing nail: Secondary | ICD-10-CM | POA: Diagnosis not present

## 2023-07-16 DIAGNOSIS — J4530 Mild persistent asthma, uncomplicated: Secondary | ICD-10-CM | POA: Diagnosis not present

## 2023-07-16 DIAGNOSIS — M79675 Pain in left toe(s): Secondary | ICD-10-CM

## 2023-07-16 DIAGNOSIS — R739 Hyperglycemia, unspecified: Secondary | ICD-10-CM

## 2023-07-16 LAB — POCT GLYCOSYLATED HEMOGLOBIN (HGB A1C): Hemoglobin A1C: 5.4 % (ref 4.0–5.6)

## 2023-07-16 LAB — GLUCOSE, CAPILLARY: Glucose-Capillary: 122 mg/dL — ABNORMAL HIGH (ref 70–99)

## 2023-07-16 MED ORDER — ALBUTEROL SULFATE HFA 108 (90 BASE) MCG/ACT IN AERS
2.0000 | INHALATION_SPRAY | Freq: Four times a day (QID) | RESPIRATORY_TRACT | 2 refills | Status: AC | PRN
  Filled 2023-07-16: qty 6.7, 25d supply, fill #0

## 2023-07-16 MED ORDER — SEMAGLUTIDE-WEIGHT MANAGEMENT 0.5 MG/0.5ML ~~LOC~~ SOAJ
0.5000 mg | SUBCUTANEOUS | 0 refills | Status: DC
  Filled 2023-07-16: qty 2, 28d supply, fill #0

## 2023-07-16 MED ORDER — SEMAGLUTIDE-WEIGHT MANAGEMENT 1 MG/0.5ML ~~LOC~~ SOAJ
1.0000 mg | SUBCUTANEOUS | 0 refills | Status: DC
  Filled 2023-07-16: qty 2, 28d supply, fill #0

## 2023-07-16 MED ORDER — SEMAGLUTIDE-WEIGHT MANAGEMENT 0.25 MG/0.5ML ~~LOC~~ SOAJ
0.2500 mg | SUBCUTANEOUS | 0 refills | Status: DC
  Filled 2023-07-16: qty 2, 28d supply, fill #0

## 2023-07-16 MED ORDER — SEMAGLUTIDE-WEIGHT MANAGEMENT 2.4 MG/0.75ML ~~LOC~~ SOAJ
2.4000 mg | SUBCUTANEOUS | 3 refills | Status: DC
  Filled 2023-07-16: qty 3, fill #0

## 2023-07-16 MED ORDER — BUDESONIDE-FORMOTEROL FUMARATE 80-4.5 MCG/ACT IN AERO
2.0000 | INHALATION_SPRAY | Freq: Two times a day (BID) | RESPIRATORY_TRACT | 12 refills | Status: AC
  Filled 2023-07-16: qty 10.2, 30d supply, fill #0

## 2023-07-16 MED ORDER — SEMAGLUTIDE-WEIGHT MANAGEMENT 1.7 MG/0.75ML ~~LOC~~ SOAJ
1.7000 mg | SUBCUTANEOUS | 0 refills | Status: DC
  Filled 2023-07-16: qty 3, 28d supply, fill #0

## 2023-07-16 MED ORDER — ZEPBOUND 2.5 MG/0.5ML ~~LOC~~ SOAJ
2.5000 mg | SUBCUTANEOUS | 0 refills | Status: DC
  Filled 2023-07-16 (×3): qty 2, 28d supply, fill #0

## 2023-07-16 NOTE — Patient Instructions (Signed)
 Thank you, Katherine Ross for allowing [place] to provide your care today.   Toe pain Please go to podiatry for further treatment for your toe. They may have to cut part of toenail away.  Asthma Would like to control your asthma symptoms better.  I am switching you to Symbicort which has 2 medications.  I have also sent in refill of albuterol rescue inhaler.  You can actually use Symbicort as your rescue inhaler and I recommend trying that but I wanted to get you access to albuterol as this has been helpful to you.  Weight loss I am sending in [place] which is a once weekly injectable medication to help with weight loss.  I will try to put as much documentation as possible to increase your chances of having this medication approved by her health insurance.  I do not know if they will approve this. Referrals ordered today:   Referral Orders         Ambulatory referral to Podiatry       I have ordered the following medication/changed the following medications:   Stop the following medications: Medications Discontinued During This Encounter  Medication Reason   oxyCODONE-acetaminophen (PERCOCET) 5-325 MG tablet    fluticasone (FLOVENT HFA) 110 MCG/ACT inhaler      Start the following medications: Meds ordered this encounter  Medications   budesonide-formoterol (SYMBICORT) 80-4.5 MCG/ACT inhaler    Sig: Inhale 2 puffs into the lungs 2 (two) times daily.    Dispense:  1 each    Refill:  12   Semaglutide-Weight Management 0.25 MG/0.5ML SOAJ    Sig: Inject 0.25 mg into the skin once a week for 28 days.    Dispense:  2 mL    Refill:  0   Semaglutide-Weight Management 0.5 MG/0.5ML SOAJ    Sig: Inject 0.5 mg into the skin once a week for 28 days.    Dispense:  2 mL    Refill:  0   Semaglutide-Weight Management 1 MG/0.5ML SOAJ    Sig: Inject 1 mg into the skin once a week for 28 days.    Dispense:  2 mL    Refill:  0   Semaglutide-Weight Management 1.7 MG/0.75ML SOAJ    Sig: Inject 1.7  mg into the skin once a week for 28 days.    Dispense:  3 mL    Refill:  0   Semaglutide-Weight Management 2.4 MG/0.75ML SOAJ    Sig: Inject 2.4 mg into the skin once a week.    Dispense:  3 mL    Refill:  3   albuterol (VENTOLIN HFA) 108 (90 Base) MCG/ACT inhaler    Sig: Inhale 2 puffs into the lungs every 6 (six) hours as needed for wheezing or shortness of breath.    Dispense:  8 g    Refill:  2     Follow up: 3 months   We look forward to seeing you next time. Please call our clinic at [phone] if you have any questions or concerns. The best time to call is Monday-Friday from 9am-4pm, but there is someone available 24/7. If after hours or the weekend, call the main hospital number and ask for the Internal Medicine Resident On-Call. If you need medication refills, please notify your pharmacy one week in advance and they will send [place] a request.   Thank you for trusting me with your care. Wishing you the best!   Rudene Christians, DO [facility]

## 2023-07-16 NOTE — Telephone Encounter (Addendum)
 Prior Authorization for patient Katherine Ross 0.25MG /0.5ML auto-injectors) came through on cover my meds was submitted   [id number]

## 2023-07-16 NOTE — Telephone Encounter (Signed)
 Prior Authorization for patient (Zepbound 2.5MG /0.5ML pen-injectors) came through on cover my meds was submitted   KEY:[id number]

## 2023-07-16 NOTE — Telephone Encounter (Signed)
 Katherine Ross (Key: [id number]) Zepbound 2.5MG /0.5ML pen-injectors Form Med[employer] ePA Form 2017 NCPDP Created Sent to Plan Plan Response Submit Clinical Questions Determination Message from Plan This drug/product is not covered under the pharmacy benefit. Prior Authorization is not available.

## 2023-07-16 NOTE — Telephone Encounter (Signed)
 Katherine Ross (Key: [id number]) Reginal Lutes 0.25MG /0.5ML auto-injectors Form [employer] ePA Form 2017 NCPDP Created Sent to Plan Submit Clinical Questions Determination Message from Plan This drug/product is not covered under the pharmacy benefit. Prior Authorization is not available.

## 2023-07-16 NOTE — Progress Notes (Unsigned)
 Subjective:  CC: weight concerns  HPI:  Ms.Katherine Ross is a 23 y.o. female with a past medical history of asthma, eczema, PCOS who presents today for weight concerns.   Please see problem based assessment and plan for additional details.  Past Medical History:  Diagnosis Date   Asthma    Eczema     MEDICATIONS:    Family History  Problem Relation Age of Onset   Diabetes Mother    Hypertension Mother    Hyperlipidemia Mother    Food Allergy Brother        tree nuts   Autism Brother    Schizophrenia Brother    Hyperlipidemia Maternal Grandmother    Hypertension Maternal Grandmother    Diabetes Maternal Grandmother    Allergic rhinitis Maternal Grandmother    Hypothyroidism Maternal Grandmother    Heart attack Maternal Grandfather    Angioedema Neg Hx    Asthma Neg Hx    Atopy Neg Hx    Eczema Neg Hx    Immunodeficiency Neg Hx    Urticaria Neg Hx     Past Surgical History:  Procedure Laterality Date   NO PAST SURGERIES       Social History   Socioeconomic History   Marital status: Single    Spouse name: Not on file   Number of children: Not on file   Years of education: Not on file   Highest education level: Not on file  Occupational History   Not on file  Tobacco Use   Smoking status: Never    Passive exposure: Never   Smokeless tobacco: Never  Vaping Use   Vaping status: Never Used  Substance and Sexual Activity   Alcohol use: Not Currently    Comment: social   Drug use: Not Currently    Types: Marijuana    Comment: last use weed years ago   Sexual activity: Not Currently    Birth control/protection: None  Other Topics Concern   Not on file  Social History Narrative   Not on file   Social Drivers of Health   Financial Resource Strain: Not on file  Food Insecurity: No Food Insecurity (09/04/2022)   Hunger Vital Sign    Worried About Running Out of Food in the Last Year: Never true    Ran Out of Food in the Last Year: Never true   Transportation Needs: No Transportation Needs (09/04/2022)   PRAPARE - Administrator, Civil Service (Medical): No    Lack of Transportation (Non-Medical): No  Physical Activity: Not on file  Stress: Not on file  Social Connections: Not on file  Intimate Partner Violence: Not At Risk (09/04/2022)   Humiliation, Afraid, Rape, and Kick questionnaire    Fear of Current or Ex-Partner: No    Emotionally Abused: No    Physically Abused: No    Sexually Abused: No    Review of Systems: ROS negative except for what is noted on the assessment and plan.  Objective:   Vitals:   07/16/23 1447 07/16/23 1537  BP: 137/71 125/73  Pulse: 90 78  Temp: 98.5 F (36.9 C)   TempSrc: Oral   SpO2: 98%   Weight: 206 lb 11.2 oz (93.8 kg)   Height: 5\' 6"  (1.676 m)     Physical Exam: Constitutional: well-appearing, in no acute distress Cardiovascular: regular rate and rhythm, no m/r/g Pulmonary/Chest: normal work of breathing on room air, lungs with mild wheezing bilaterally MSK: left great toe  with erythema and edema to medial nailbed Neurological:  normal gait Skin: warm and dry  Assessment & Plan:  BMI 32.0-32.9,adult She is concerned as she heaviest that she has been in her life.  Since having her baby 10 months ago she got a treadmill and has made several dietary changes in an effort to try to lose pregnancy weight.  She has been walking/running on treadmill 30 minutes a day.  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  For her diet she has cut out sodas, stopped eating meat and is only eating fish.  She is also increased vegetables dairy.  Her efforts have been for the last 9 months and she has not been able to lose any weight.  She is concerned she has a family history of diabetes and wants to do everything she can to prevent this. P: Screening A1c 5.4 Ozempic 0.25 mg sent  Addendum: None of the GLP-1's are covered by her insurance.  I called and  reviewed this with patient.  I was initially hesitant to start phentermine topiramate as her initial blood pressure had systolics in the 130s.  Repeat blood pressure improved to 125/73.  I think it would be reasonable to try phentermine with her.  I reviewed potential side effects from medication and.  She does have blood pressure cuff at home and she will plan to record readings with addition of medication. -Start phentermine topiramate 3.75-23 mg for 14 days Will have her follow-up in 2 weeks.  If she is tolerating well and blood pressure controlled at visit would increase to phentermine and topiramate 7.5 mg / 46 mg.  Would plan to reassess BMI 12 weeks after initiating therapy.  Toe pain She has had pain in left great toe for the last several days.  She has not noticed any drainage.  On exam she has ingrown toenail. Plan: Referral to podiatry placed  Asthma, mild persistent She is using a low-dose fluticasone inhaler twice daily.  She continues to need rescue inhaler with albuterol most days.  She does feel like her activity is limited due to wheezing. She denies nocturnal awakenings. P: Start ICS/ LABA with symbicort, can use as rescue as well She would like to have albuterol inhaler on hand, refill sent   Patient discussed with Dr. Marjorie Smolder Oluwafemi Villella, D.O. [facility]  PGY-3 Pager: [phone]  Phone: [phone] Date 07/17/2023  Time 3:29 PM

## 2023-07-17 ENCOUNTER — Other Ambulatory Visit (HOSPITAL_COMMUNITY): Payer: Self-pay

## 2023-07-17 ENCOUNTER — Other Ambulatory Visit: Payer: Self-pay

## 2023-07-17 ENCOUNTER — Encounter (HOSPITAL_COMMUNITY): Payer: Self-pay

## 2023-07-17 ENCOUNTER — Telehealth: Payer: Self-pay

## 2023-07-17 DIAGNOSIS — M79676 Pain in unspecified toe(s): Secondary | ICD-10-CM | POA: Insufficient documentation

## 2023-07-17 MED ORDER — PHENTERMINE-TOPIRAMATE ER 3.75-23 MG PO CP24
1.0000 | ORAL_CAPSULE | Freq: Every day | ORAL | 0 refills | Status: DC
  Filled 2023-07-17 – 2023-07-18 (×4): qty 14, 14d supply, fill #0

## 2023-07-17 MED ORDER — OZEMPIC (0.25 OR 0.5 MG/DOSE) 2 MG/3ML ~~LOC~~ SOPN
0.2500 mg | PEN_INJECTOR | SUBCUTANEOUS | 2 refills | Status: DC
  Filled 2023-07-17: qty 3, 28d supply, fill #0
  Filled 2023-07-17: qty 3, 56d supply, fill #0

## 2023-07-17 NOTE — Telephone Encounter (Signed)
 Katherine Ross (Key: [id number]) Need Help? Call us at [phone] Outcome Additional Information Required Member should be able to get the drug/product without a PA at this time. Drug Phentermine HCl 37.5MG  capsules ePA cloud logo Form MedImpact ePA Form 2017 NCPDP

## 2023-07-17 NOTE — Telephone Encounter (Signed)
 Prior Authorization for patient (Phentermine HCl 37.5MG  capsules) came through on cover my meds was submitted.  ION:[id number]

## 2023-07-17 NOTE — Assessment & Plan Note (Addendum)
 She is concerned as she heaviest that she has been in her life.  Since having her baby 10 months ago she got a treadmill and has made several dietary changes in an effort to try to lose pregnancy weight.  She has been walking/running on treadmill 30 minutes a day.  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  For her diet she has cut out sodas, stopped eating meat and is only eating fish.  She is also increased vegetables dairy.  Her efforts have been for the last 9 months and she has not been able to lose any weight.  She is concerned she has a family history of diabetes and wants to do everything she can to prevent this. P: Screening A1c 5.4 Ozempic 0.25 mg sent  Addendum: None of the GLP-1's are covered by her insurance.  I called and reviewed this with patient.  I was initially hesitant to start phentermine topiramate as her initial blood pressure had systolics in the 130s.  Repeat blood pressure improved to 125/73.  I think it would be reasonable to try phentermine with her.  I reviewed potential side effects from medication and.  She does have blood pressure cuff at home and she will plan to record readings with addition of medication. -Start phentermine topiramate 3.75-23 mg for 14 days Will have her follow-up in 2 weeks.  If she is tolerating well and blood pressure controlled at visit would increase to phentermine and topiramate 7.5 mg / 46 mg.  Would plan to reassess BMI 12 weeks after initiating therapy.

## 2023-07-17 NOTE — Assessment & Plan Note (Signed)
 She has had pain in left great toe for the last several days.  She has not noticed any drainage.  On exam she has ingrown toenail. Plan: Referral to podiatry placed

## 2023-07-17 NOTE — Addendum Note (Signed)
 Addended by: Lucille Passy on: 07/17/2023 10:19 AM   Modules accepted: Orders

## 2023-07-17 NOTE — Assessment & Plan Note (Signed)
 She is using a low-dose fluticasone inhaler twice daily.  She continues to need rescue inhaler with albuterol most days.  She does feel like her activity is limited due to wheezing. She denies nocturnal awakenings. P: Start ICS/ LABA with symbicort, can use as rescue as well She would like to have albuterol inhaler on hand, refill sent

## 2023-07-18 ENCOUNTER — Other Ambulatory Visit (HOSPITAL_COMMUNITY): Payer: Self-pay

## 2023-07-18 ENCOUNTER — Encounter: Payer: Self-pay | Admitting: Internal Medicine

## 2023-07-21 NOTE — Telephone Encounter (Signed)
 Katherine Ross (Key: [id number]) PA Case ID #: [id number] Need Help? Call us at [phone] Outcome Approved on April 4 by MedImpact 2017 The request has been approved. The authorization is effective from 07/18/2023 to 08/01/2023, as long as the member is enrolled in their current health plan. The request was approved as submitted. This request was approved with a quantity limit of 1 capsule per day.An additional authorization has been entered for Qsymia 7.5 mg-46 mg capsule, allowing 1 capsule per day effective 07/29/2023 through 10/27/2023; please reference authorization [id number]. A written notification letter will follow with additional details. Effective Date: 07/18/2023 Authorization Expiration Date: 08/01/2023 Drug Qsymia 3.75-23MG  er capsules ePA cloud logo Form MedImpact ePA Form 2017 NCPDP

## 2023-07-25 ENCOUNTER — Encounter: Payer: Self-pay | Admitting: Podiatry

## 2023-07-25 ENCOUNTER — Ambulatory Visit (INDEPENDENT_AMBULATORY_CARE_PROVIDER_SITE_OTHER): Admitting: Podiatry

## 2023-07-25 DIAGNOSIS — L84 Corns and callosities: Secondary | ICD-10-CM

## 2023-07-25 NOTE — Progress Notes (Signed)
       Chief Complaint  Patient presents with   Nail Problem    "My left big toe, I think I have an ingrown." N - ingrown toenail L - hallux left D - over 1 month O - suddenly C - tender, sharp pain, red and purple before A - touch T - none    HPI: 23 y.o. female presents today with concern for left great toe pain. She things she may haven an ingrown to the lateral aspect. Tender with touch.  Has not had drainage, significant swelling, redness.  Past Medical History:  Diagnosis Date   Asthma    Eczema     Past Surgical History:  Procedure Laterality Date   NO PAST SURGERIES      Allergies  Allergen Reactions   Apple Itching   Penicillins Other (See Comments)    All kinds     ROS denies any nausea, vomiting, fever, chills, chest pain, shortness of breath   Physical Exam: There were no vitals filed for this visit.  General: The patient is alert and oriented x3 in no acute distress.  Dermatology: Skin is warm, dry and supple bilateral lower extremities. Interspaces are clear of maceration and debris. Tenderness on distal lateral nailfold, there is callus to the nail fold here without significant incurvation of the nail.  Vascular: Palpable pedal pulses bilaterally. Capillary refill within normal limits.  No appreciable edema.  No erythema or calor.  Neurological: Light touch sensation grossly intact bilateral feet.   Musculoskeletal Exam: No pedal deformities noted   Assessment/Plan of Care: 1. Callus of toe      No orders of the defined types were placed in this encounter.  None  Discussed clinical findings with patient today.  Callus to the right hallux lateral nail fold was sharply debrided with a 15 blade to patient tolerance today without incident.  This was done as a Research officer, political party.  Immediate relief to the area was noted.  Advised patient to monitor this area.  She can scrub the nail fold with white vinegar to keep any recurrent callus soft.  In the  meantime can do Epsom salt soaks and apply Neosporin to keep this area soft over the next 5 to 7 days.  Follow-up as needed.   Slater Mcmanaman L. Lunda Salines, AACFAS [facility]     [address] [place], [place] [address]                Office [phone]  Fax [phone]

## 2023-07-26 NOTE — Progress Notes (Signed)
 Internal Medicine Clinic Attending  Case discussed with the resident at the time of the visit.  We reviewed the resident's history and exam and pertinent patient test results.  I agree with the assessment, diagnosis, and plan of care documented in the resident's note.

## 2023-07-28 ENCOUNTER — Other Ambulatory Visit (HOSPITAL_COMMUNITY): Payer: Self-pay

## 2023-07-30 ENCOUNTER — Other Ambulatory Visit (HOSPITAL_COMMUNITY): Payer: Self-pay

## 2023-07-31 ENCOUNTER — Other Ambulatory Visit (HOSPITAL_COMMUNITY): Payer: Self-pay

## 2023-09-01 ENCOUNTER — Other Ambulatory Visit (HOSPITAL_COMMUNITY): Payer: Self-pay

## 2023-09-01 ENCOUNTER — Other Ambulatory Visit: Payer: Self-pay

## 2023-09-02 ENCOUNTER — Other Ambulatory Visit (HOSPITAL_COMMUNITY): Payer: Self-pay

## 2023-10-27 ENCOUNTER — Other Ambulatory Visit: Payer: Self-pay

## 2023-10-27 ENCOUNTER — Other Ambulatory Visit (HOSPITAL_COMMUNITY): Payer: Self-pay

## 2023-11-05 ENCOUNTER — Other Ambulatory Visit: Payer: Self-pay

## 2024-03-05 ENCOUNTER — Emergency Department (HOSPITAL_BASED_OUTPATIENT_CLINIC_OR_DEPARTMENT_OTHER)

## 2024-03-05 ENCOUNTER — Encounter (HOSPITAL_BASED_OUTPATIENT_CLINIC_OR_DEPARTMENT_OTHER): Payer: Self-pay

## 2024-03-05 ENCOUNTER — Emergency Department (HOSPITAL_BASED_OUTPATIENT_CLINIC_OR_DEPARTMENT_OTHER)
Admission: EM | Admit: 2024-03-05 | Discharge: 2024-03-05 | Disposition: A | Discharge status: Home / Self Care | Attending: Emergency Medicine | Admitting: Emergency Medicine

## 2024-03-05 ENCOUNTER — Other Ambulatory Visit: Payer: Self-pay

## 2024-03-05 DIAGNOSIS — M62838 Other muscle spasm: Secondary | ICD-10-CM | POA: Diagnosis not present

## 2024-03-05 DIAGNOSIS — Y92410 Unspecified street and highway as the place of occurrence of the external cause: Secondary | ICD-10-CM | POA: Diagnosis not present

## 2024-03-05 DIAGNOSIS — M62830 Muscle spasm of back: Secondary | ICD-10-CM | POA: Insufficient documentation

## 2024-03-05 DIAGNOSIS — M542 Cervicalgia: Secondary | ICD-10-CM | POA: Insufficient documentation

## 2024-03-05 DIAGNOSIS — Z041 Encounter for examination and observation following transport accident: Secondary | ICD-10-CM | POA: Diagnosis not present

## 2024-03-05 DIAGNOSIS — M25571 Pain in right ankle and joints of right foot: Secondary | ICD-10-CM | POA: Diagnosis not present

## 2024-03-05 DIAGNOSIS — R519 Headache, unspecified: Secondary | ICD-10-CM | POA: Insufficient documentation
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
## 2024-03-05 NOTE — Discharge Instructions (Signed)
 It was a pleasure taking care of you here today.  Rotate Tylenol  and Motrin  as needed for pain.  Make sure to follow-up outpatient, return for any worsening symptoms

## 2024-03-05 NOTE — ED Notes (Signed)
 Patient transferred from waiting room to ED treatment room. Assuming pt care at this time.
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
## 2024-04-19 ENCOUNTER — Telehealth (HOSPITAL_BASED_OUTPATIENT_CLINIC_OR_DEPARTMENT_OTHER): Payer: Self-pay

## 2024-04-19 NOTE — Telephone Encounter (Signed)
 Spoke with patient. Patient states that she started her menses 03/27/24. Has been wearing diapers that she is changing every few hours when she uses the restrooms. Having increased fatigue and slight dizziness. Denies any SOB. Reports bleeding stopped yesterday. Is no longer using Nuvaring. Has had a new partner and has concerns for STIs. Reports she has lost 18 pounds since [place] and is very concerned. Appointment scheduled for tomorrow 04/20/2024 at 1 pm with Dr.Miller. Patient is agreeable to date and time.

## 2024-04-20 ENCOUNTER — Encounter (HOSPITAL_BASED_OUTPATIENT_CLINIC_OR_DEPARTMENT_OTHER): Payer: Self-pay | Admitting: Obstetrics & Gynecology

## 2024-04-20 ENCOUNTER — Other Ambulatory Visit (HOSPITAL_COMMUNITY)
Admission: RE | Admit: 2024-04-20 | Discharge: 2024-04-20 | Disposition: A | Discharge status: Home / Self Care | Source: Ambulatory Visit | Attending: Obstetrics & Gynecology | Admitting: Obstetrics & Gynecology

## 2024-04-20 ENCOUNTER — Ambulatory Visit (INDEPENDENT_AMBULATORY_CARE_PROVIDER_SITE_OTHER): Admitting: Obstetrics & Gynecology

## 2024-04-20 VITALS — BP 132/82 | HR 68 | Ht 64.0 in | Wt 193.4 lb

## 2024-04-20 DIAGNOSIS — Z113 Encounter for screening for infections with a predominantly sexual mode of transmission: Secondary | ICD-10-CM

## 2024-04-20 DIAGNOSIS — N898 Other specified noninflammatory disorders of vagina: Secondary | ICD-10-CM | POA: Diagnosis present

## 2024-04-20 DIAGNOSIS — N926 Irregular menstruation, unspecified: Secondary | ICD-10-CM | POA: Diagnosis not present

## 2024-04-20 NOTE — Progress Notes (Signed)
" ° °  GYNECOLOGY  VISIT  CC:   Menstrual Problem   HPI: 24 y.o. G11P1001 Single Black or African American female here for concerns about irregular bleeding that occurred in December and into January that last more than three weeks.  She delivered her son in may, 2024.  This was a vaginal delivery.  He is about 13 months old.  She is still breast feeding but down to twice daily.  She had irregular bleeding for about 8 months after delivery but this stopped and then had irregular bleeding but that lighter than a typical cycle and it wasn't monthly.    She is also concerned about weight loss.  She reports she was 211# on 11/11.  She has been changing her diet but has lost about 18 pounds so this seems abnormal.    Last pap was 2023 and was normal.  She has used a nuva ring this past year.  She hasn't used since August because it caused cramping.    Had new partner two weeks ago and desires STI testing.  Has noted some vaginal discharge over the past two days.    Patient's last menstrual period was 03/27/2024 (approximate).  Past Medical History:  Diagnosis Date   Asthma    Eczema     MEDS:  Reviewed in EPIC  ALLERGIES: Apple and Penicillins  SH:  single, non smoker  Review of Systems  Constitutional: Negative.   Genitourinary:        Vaginal discharge    PHYSICAL EXAMINATION:    BP 132/82 (BP Location: Right Arm, Patient Position: Sitting, Cuff Size: Normal)   Pulse 68   Ht 5' 4 (1.626 m)   Wt 193 lb 6.4 oz (87.7 kg)   LMP 03/27/2024 (Approximate)   SpO2 100%   Breastfeeding Yes   BMI 33.20 kg/m     General appearance: alert, cooperative and appears stated age Neck: no adenopathy, supple, symmetrical, trachea midline and thyroid  normal to inspection and palpation CV:  Regular rate and rhythm Lungs:  clear to auscultation, no wheezes, rales or rhonchi, symmetric air entry  Lymph:  no inguinal LAD noted Pelvic: External genitalia:  no lesions              Urethra:  normal  appearing urethra with no masses, tenderness or lesions              Bartholins and Skenes: normal                 Vagina: whitish discharge present              Cervix: no lesions              Bimanual Exam:  Uterus:  normal size, contour, position, consistency, mobility, non-tender              Adnexa: no mass, fullness, tenderness  Chaperone was present for exam.  Assessment/Plan: 1. Irregular bleeding (Primary) - no bleeding on exam today and uterus normal on exam.  - Iron , TIBC and Ferritin Panel - Ferritin - Iron  and TIBC - CBC - Comprehensive metabolic panel with GFR - TSH  2. Vaginal discharge - will check for BV and yeast - Cervicovaginal ancillary only( [place])  3. Screening examination for STI - Gc/Chl/trich testing obtained today as well - RPR+HBsAg+HIV - Hepatitis C antibody    "

## 2024-04-21 LAB — COMPREHENSIVE METABOLIC PANEL WITH GFR
ALT: 29 IU/L (ref 0–32)
AST: 22 IU/L (ref 0–40)
Albumin: 5 g/dL (ref 4.0–5.0)
Alkaline Phosphatase: 64 IU/L (ref 41–116)
BUN/Creatinine Ratio: 16 (ref 9–23)
BUN: 11 mg/dL (ref 6–20)
Bilirubin Total: 0.3 mg/dL (ref 0.0–1.2)
CO2: 20 mmol/L (ref 20–29)
Calcium: 9.5 mg/dL (ref 8.7–10.2)
Chloride: 102 mmol/L (ref 96–106)
Creatinine, Ser: 0.7 mg/dL (ref 0.57–1.00)
Globulin, Total: 2.7 g/dL (ref 1.5–4.5)
Glucose: 82 mg/dL (ref 70–99)
Potassium: 4.3 mmol/L (ref 3.5–5.2)
Sodium: 139 mmol/L (ref 134–144)
Total Protein: 7.7 g/dL (ref 6.0–8.5)
eGFR: 123 mL/min/1.73

## 2024-04-21 LAB — CERVICOVAGINAL ANCILLARY ONLY
Bacterial Vaginitis (gardnerella): POSITIVE — AB
Candida Glabrata: NEGATIVE
Candida Vaginitis: NEGATIVE
Chlamydia: NEGATIVE
Comment: NEGATIVE
Comment: NEGATIVE
Comment: NEGATIVE
Comment: NEGATIVE
Comment: NEGATIVE
Comment: NORMAL
Neisseria Gonorrhea: NEGATIVE
Trichomonas: NEGATIVE

## 2024-04-21 LAB — FERRITIN: Ferritin: 142 ng/mL (ref 15–150)

## 2024-04-21 LAB — CBC
Hematocrit: 38.9 % (ref 34.0–46.6)
Hemoglobin: 12.7 g/dL (ref 11.1–15.9)
MCH: 26.8 pg (ref 26.6–33.0)
MCHC: 32.6 g/dL (ref 31.5–35.7)
MCV: 82 fL (ref 79–97)
Platelets: 325 x10E3/uL (ref 150–450)
RBC: 4.74 x10E6/uL (ref 3.77–5.28)
RDW: 13.3 % (ref 11.7–15.4)
WBC: 5.5 x10E3/uL (ref 3.4–10.8)

## 2024-04-21 LAB — RPR+HBSAG+HIV
HIV Screen 4th Generation wRfx: NONREACTIVE
Hepatitis B Surface Ag: NEGATIVE
RPR Ser Ql: NONREACTIVE

## 2024-04-21 LAB — IRON AND TIBC
Iron Saturation: 26 % (ref 15–55)
Iron: 100 ug/dL (ref 27–159)
Total Iron Binding Capacity: 385 ug/dL (ref 250–450)
UIBC: 285 ug/dL (ref 131–425)

## 2024-04-21 LAB — TSH: TSH: 1.3 u[IU]/mL (ref 0.450–4.500)

## 2024-04-21 LAB — HEPATITIS C ANTIBODY: Hep C Virus Ab: NONREACTIVE

## 2024-04-24 ENCOUNTER — Ambulatory Visit (HOSPITAL_BASED_OUTPATIENT_CLINIC_OR_DEPARTMENT_OTHER): Payer: Self-pay | Admitting: Obstetrics & Gynecology

## 2024-04-24 MED ORDER — METRONIDAZOLE 500 MG PO TABS: 500.0000 mg | ORAL_TABLET | Freq: Two times a day (BID) | ORAL | 0 refills | Status: AC

## 2024-04-26 ENCOUNTER — Other Ambulatory Visit (HOSPITAL_BASED_OUTPATIENT_CLINIC_OR_DEPARTMENT_OTHER): Payer: Self-pay

## 2024-04-26 DIAGNOSIS — N926 Irregular menstruation, unspecified: Secondary | ICD-10-CM

## 2024-04-27 NOTE — Progress Notes (Signed)
" ° °  Ultrasound f/u Patient name: Katherine Ross MRN [id number]  Date of birth: [birth year] Chief Complaint:   Follow-up  History of Present Illness:   Katherine Ross is a 24 y.o. G39P1001 African-American female being seen today for discussion of ultrasound findings.  This was done due to prolonged bleeding for about a month.  Ultrasound is normal.  Endometrium thin at 3.30mm.  Ovaries normal.  No free fluid.  She is still breastfeeding at night for comfort for child.  Options for treatment discussed.  She would like to try treatment.  Options discussed.  Given still breast feeding, will try POPs.  Rx to pharmacy.  Pt is going to [place] for about a month and will need 90 day supply.  She would prefer pill that is more like a typical OCP so will send rx for Slynd .  Knows this may need prior authorization and, if does, will switch to micronor .  If has side effects, then can proceed with prior-authorization.    Patient's last menstrual period was 03/27/2024 (approximate).  Last pap 02/21/2022. Results were: negative.   Review of Systems:   Pertinent items are noted in HPI Denies any urinary or bowel changes or pelvic pain Pertinent History Reviewed:  Reviewed past medical,surgical, social and family history.  Reviewed problem list, medications and allergies. Physical Assessment:   Vitals:   04/28/24 1542  BP: 127/76  Pulse: 60  SpO2: 100%  There is no height or weight on file to calculate BMI.        Physical Examination:   General appearance - well appearing, and in no distress  Mental status - alert, oriented to person, place, and time  Psych:  She has a normal mood and affect  Assessment & Plan:  1. Irregular bleeding (Primary) - will start slynd  for now.  Pt can start when she gets from pharmacy.  Dosing, side effects, reasons to call discussed. - Drospirenone  (SLYND ) 4 MG TABS; Take 1 tablet (4 mg total) by mouth daily.  Dispense: 84 tablet; Refill: 1   No orders of the defined  types were placed in this encounter.   Meds:  Meds ordered this encounter  Medications   Drospirenone  (SLYND ) 4 MG TABS    Sig: Take 1 tablet (4 mg total) by mouth daily.    Dispense:  84 tablet    Refill:  1    Follow-up: No follow-ups on file.  Ronal GORMAN Pinal, MD 05/01/2024 10:08 PM GYNECOLOGY  VISIT   "

## 2024-04-28 ENCOUNTER — Ambulatory Visit (INDEPENDENT_AMBULATORY_CARE_PROVIDER_SITE_OTHER): Payer: Self-pay | Admitting: Obstetrics & Gynecology

## 2024-04-28 ENCOUNTER — Encounter (HOSPITAL_BASED_OUTPATIENT_CLINIC_OR_DEPARTMENT_OTHER): Payer: Self-pay | Admitting: Obstetrics & Gynecology

## 2024-04-28 ENCOUNTER — Ambulatory Visit (INDEPENDENT_AMBULATORY_CARE_PROVIDER_SITE_OTHER)

## 2024-04-28 ENCOUNTER — Other Ambulatory Visit (HOSPITAL_COMMUNITY): Payer: Self-pay

## 2024-04-28 ENCOUNTER — Encounter: Payer: Self-pay | Admitting: Pharmacist

## 2024-04-28 VITALS — BP 127/76 | HR 60

## 2024-04-28 DIAGNOSIS — N926 Irregular menstruation, unspecified: Secondary | ICD-10-CM

## 2024-04-28 DIAGNOSIS — N920 Excessive and frequent menstruation with regular cycle: Secondary | ICD-10-CM

## 2024-04-28 MED ORDER — SLYND 4 MG PO TABS
1.0000 | ORAL_TABLET | Freq: Every day | ORAL | 1 refills | Status: AC
  Filled 2024-04-28: qty 84, 84d supply, fill #0

## 2024-05-11 ENCOUNTER — Other Ambulatory Visit (HOSPITAL_COMMUNITY): Payer: Self-pay
